# Patient Record
Sex: Female | Born: 1990 | Race: Black or African American | Hispanic: No | Marital: Single | Smoking: Former smoker
Health system: Southern US, Community
[De-identification: ages and names within clinical notes are randomized; demographics above are authoritative.]

## PROBLEM LIST (undated history)

## (undated) ENCOUNTER — Inpatient Hospital Stay (HOSPITAL_COMMUNITY): Payer: Self-pay

## (undated) DIAGNOSIS — A749 Chlamydial infection, unspecified: Secondary | ICD-10-CM

## (undated) HISTORY — PX: CYSTECTOMY: SUR359

---
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
## 2011-03-04 LAB — POCT RAPID STREP A: Streptococcus, Group A Screen (Direct): NEGATIVE

## 2011-05-27 NOTE — L&D Delivery Note (Signed)
Delivery Note At 11:49 PM a viable female was delivered via Vaginal, Vacuum (Extractor) (Presentation: Middle Occiput Anterior).  APGAR: 7, 8; weight .   Placenta status: Intact, Spontaneous.  Cord:  with the following complications: .  Cord pH: not done  Anesthesia: Epidural Local  Episiotomy: Median Lacerations:  Suture Repair: 2.0 vicryl Est. Blood Loss (mL):   Mom to postpartum.  Baby to nursery-stable.  MARSHALL,BERNARD A 02/14/2012, 12:02 AM

## 2011-06-22 ENCOUNTER — Encounter (HOSPITAL_COMMUNITY): Payer: Self-pay | Admitting: *Deleted

## 2011-06-22 ENCOUNTER — Inpatient Hospital Stay (HOSPITAL_COMMUNITY): Payer: Medicaid Other

## 2011-06-22 ENCOUNTER — Inpatient Hospital Stay (HOSPITAL_COMMUNITY)
Admission: AD | Admit: 2011-06-22 | Discharge: 2011-06-22 | Disposition: A | Discharge status: Home / Self Care | Payer: Medicaid Other | Source: Ambulatory Visit | Attending: Obstetrics & Gynecology | Admitting: Obstetrics & Gynecology

## 2011-06-22 DIAGNOSIS — O209 Hemorrhage in early pregnancy, unspecified: Secondary | ICD-10-CM | POA: Insufficient documentation

## 2011-06-22 DIAGNOSIS — Z8759 Personal history of other complications of pregnancy, childbirth and the puerperium: Secondary | ICD-10-CM

## 2011-06-22 DIAGNOSIS — O468X9 Other antepartum hemorrhage, unspecified trimester: Secondary | ICD-10-CM

## 2011-06-22 DIAGNOSIS — O26851 Spotting complicating pregnancy, first trimester: Secondary | ICD-10-CM | POA: Diagnosis present

## 2011-06-22 DIAGNOSIS — O4590 Premature separation of placenta, unspecified, unspecified trimester: Secondary | ICD-10-CM

## 2011-06-22 DIAGNOSIS — O09299 Supervision of pregnancy with other poor reproductive or obstetric history, unspecified trimester: Secondary | ICD-10-CM

## 2011-06-22 HISTORY — DX: Chlamydial infection, unspecified: A74.9

## 2011-06-22 LAB — URINALYSIS, ROUTINE W REFLEX MICROSCOPIC
Leukocytes, UA: NEGATIVE
Nitrite: NEGATIVE
Protein, ur: NEGATIVE mg/dL
Specific Gravity, Urine: 1.025 (ref 1.005–1.030)
Urobilinogen, UA: 0.2 mg/dL (ref 0.0–1.0)

## 2011-06-22 LAB — URINE MICROSCOPIC-ADD ON

## 2011-06-22 LAB — WET PREP, GENITAL: Trich, Wet Prep: NONE SEEN

## 2011-06-22 LAB — HCG, QUANTITATIVE, PREGNANCY: hCG, Beta Chain, Quant, S: 26893 m[IU]/mL — ABNORMAL HIGH (ref <5)

## 2011-06-22 LAB — POCT PREGNANCY, URINE: Preg Test, Ur: POSITIVE — AB

## 2011-06-22 LAB — ABO/RH: ABO/RH(D): AB POS

## 2011-06-22 MED ORDER — CONCEPT OB 130-92.4-1 MG PO CAPS: 1.0000 | ORAL_CAPSULE | Freq: Every day | ORAL | Status: DC

## 2011-06-22 NOTE — Progress Notes (Signed)
Pt LMP 05/15/2011, +UPT at home, spotting x 1 wk.  Denies pain.

## 2011-06-22 NOTE — Discharge Instructions (Signed)
Prenatal Care Providers °[facility]    [facility] ° Phone- [phone]     Phone: [phone] °         °[facility]                      [facility] ° @[place]     Phone: [phone] ° Phone: [phone] °        [facility] °[facility]     Phone: [phone] ° Phone: [phone]   °        [facility] °[facility] [place]                hone: [phone] ° Phone: [phone] °        [facility] °Dr. Bernard Marshall      Phone: [phone] ° Phone: [phone] °         [facility] °[facility]                Phone: [phone] ° [facility]  ° Phone:[phone]    [facility]) °         Phone: [phone] °[facility] °  Phone: [phone] °

## 2011-06-22 NOTE — ED Provider Notes (Signed)
History   Lisa Woods is a 21 y.o. year old G1P0 female at [redacted]w[redacted]d weeks gestation by uncertain, early LMP who presents to MAU reporting spotting x 1 week and pos home UPT. She denies abd pain or passage of tissue. .   Chief Complaint  Patient presents with  . Vaginal Bleeding   HPI  OB History    Grav Para Term Preterm Abortions TAB SAB Ect Mult Living   1               Past Medical History  Diagnosis Date  . Chlamydia     Past Surgical History  Procedure Date  . Cystectomy     History reviewed. No pertinent family history.  History  Substance Use Topics  . Smoking status: Current Some Day Smoker -- 0.2 packs/day    Types: Cigarettes  . Smokeless tobacco: Not on file  . Alcohol Use: No    Allergies: No Known Allergies  No prescriptions prior to admission    ROS: Otherwise neg Physical Exam   Blood pressure 126/65, pulse 82, temperature 98.5 F (36.9 C), temperature source Oral, resp. rate 16, height 5\' 4"  (1.626 m), weight 58.514 kg (129 lb), last menstrual period 05/15/2011.  Physical Exam  Constitutional: She is oriented to person, place, and time. She appears well-developed and well-nourished. No distress.  Cardiovascular: Normal rate.   Respiratory: Effort normal.  GI: Soft. There is no tenderness.  Genitourinary: There is no lesion on the right labia. There is no lesion on the left labia. Uterus is enlarged (slightly). Uterus is not tender. Cervix exhibits no motion tenderness, no discharge and no friability. Right adnexum displays no mass and no tenderness. Left adnexum displays no mass and no tenderness. There is bleeding (moderate amount of brown blood in vault. ) around the vagina.  Musculoskeletal: Normal range of motion.  Neurological: She is alert and oriented to person, place, and time.  Skin: Skin is warm and dry.  Psychiatric: She has a normal mood and affect.    MAU Course  Procedures  MDM Results for orders placed during the hospital  encounter of 06/22/11 (from the past 24 hour(s))  URINALYSIS, ROUTINE W REFLEX MICROSCOPIC     Status: Abnormal   Collection Time   06/22/11  8:32 PM      Component Value Range   Color, Urine YELLOW  YELLOW    APPearance CLEAR  CLEAR    Specific Gravity, Urine 1.025  1.005 - 1.030    pH 7.0  5.0 - 8.0    Glucose, UA NEGATIVE  NEGATIVE (mg/dL)   Hgb urine dipstick SMALL (*) NEGATIVE    Bilirubin Urine NEGATIVE  NEGATIVE    Ketones, ur NEGATIVE  NEGATIVE (mg/dL)   Protein, ur NEGATIVE  NEGATIVE (mg/dL)   Urobilinogen, UA 0.2  0.0 - 1.0 (mg/dL)   Nitrite NEGATIVE  NEGATIVE    Leukocytes, UA NEGATIVE  NEGATIVE   URINE MICROSCOPIC-ADD ON     Status: Abnormal   Collection Time   06/22/11  8:32 PM      Component Value Range   Squamous Epithelial / LPF FEW (*) RARE    WBC, UA 0-2  <3 (WBC/hpf)  POCT PREGNANCY, URINE     Status: Abnormal   Collection Time   06/22/11  8:38 PM      Component Value Range   Preg Test, Ur POSITIVE (*) NEGATIVE    US Ob Comp Less 14 Wks  06/22/2011  *RADIOLOGY REPORT*  Clinical  Data: Pregnant, spotting, 5 weeks 3 days by LMP; quantitative beta HCG 26,893  OBSTETRIC <14 WK [place] AND TRANSVAGINAL OB US  Technique:  Both transabdominal and transvaginal ultrasound examinations were performed for complete evaluation of the gestation as well as the maternal uterus, adnexal regions, and pelvic cul-de-sac.  Transvaginal technique was performed to assess early pregnancy.  Comparison:  None  Intrauterine gestational sac:  Visualized/normal in shape. Yolk sac: Present Embryo: Present Cardiac Activity: Present Heart Rate: 125 bpm  CRL: 3.6 mm     6 w   1 d           [place] EDC: 02/14/2012  Maternal uterus/adnexae: Small subchorionic hemorrhage. Trace free pelvic fluid. Right ovary normal size at 4.3 x 2.0 x 2.8 cm a small corpus luteal cyst. Left ovary normal size and morphology, 3.6 x 1.5 x 1.6 cm. No pelvic masses.  IMPRESSION: Single live early intrauterine gestation measured at 6 weeks  1 day EGA. Small subchorionic hemorrhage.  Original Report Authenticated By: Lollie Marrow, M.D.   US Ob Transvaginal  06/22/2011  *RADIOLOGY REPORT*  Clinical Data: Pregnant, spotting, 5 weeks 3 days by LMP; quantitative beta HCG 26,893  OBSTETRIC <14 WK [place] AND TRANSVAGINAL OB US  Technique:  Both transabdominal and transvaginal ultrasound examinations were performed for complete evaluation of the gestation as well as the maternal uterus, adnexal regions, and pelvic cul-de-sac.  Transvaginal technique was performed to assess early pregnancy.  Comparison:  None  Intrauterine gestational sac:  Visualized/normal in shape. Yolk sac: Present Embryo: Present Cardiac Activity: Present Heart Rate: 125 bpm  CRL: 3.6 mm     6 w   1 d           [place] EDC: 02/14/2012  Maternal uterus/adnexae: Small subchorionic hemorrhage. Trace free pelvic fluid. Right ovary normal size at 4.3 x 2.0 x 2.8 cm a small corpus luteal cyst. Left ovary normal size and morphology, 3.6 x 1.5 x 1.6 cm. No pelvic masses.  IMPRESSION: Single live early intrauterine gestation measured at 6 weeks 1 day EGA. Small subchorionic hemorrhage.  Original Report Authenticated By: Lollie Marrow, M.D.    Assessment and Plan  Assessment: 1. 6.1 week IUP w/ small [facility]  Plan: 1. D/C home 2. Start PNC 3. Pregnancy verification latter given 4. Bleeding precautions ans pelvic rest x 1 week  Dorathy Kinsman 06/22/2011, 11:14 PM

## 2011-06-22 NOTE — Progress Notes (Signed)
Spotting x1 week, no pain.

## 2011-06-29 NOTE — ED Provider Notes (Signed)
Agree with above note.  Twylia Oka H. 06/29/2011 7:34 PM

## 2011-07-29 LAB — OB RESULTS CONSOLE HEPATITIS B SURFACE ANTIGEN: Hepatitis B Surface Ag: NEGATIVE

## 2011-07-29 LAB — OB RESULTS CONSOLE ANTIBODY SCREEN: Antibody Screen: NEGATIVE

## 2011-09-30 ENCOUNTER — Other Ambulatory Visit: Payer: Self-pay | Admitting: Obstetrics & Gynecology

## 2011-09-30 DIAGNOSIS — Z3689 Encounter for other specified antenatal screening: Secondary | ICD-10-CM

## 2011-10-01 ENCOUNTER — Ambulatory Visit (HOSPITAL_COMMUNITY)
Admission: RE | Admit: 2011-10-01 | Discharge: 2011-10-01 | Payer: Medicaid Other | Source: Ambulatory Visit | Attending: Obstetrics & Gynecology | Admitting: Obstetrics & Gynecology

## 2011-10-03 ENCOUNTER — Ambulatory Visit (HOSPITAL_COMMUNITY)
Admission: RE | Admit: 2011-10-03 | Discharge: 2011-10-03 | Disposition: A | Discharge status: Home / Self Care | Payer: Medicaid Other | Source: Ambulatory Visit | Attending: Obstetrics & Gynecology | Admitting: Obstetrics & Gynecology

## 2011-10-03 DIAGNOSIS — Z3689 Encounter for other specified antenatal screening: Secondary | ICD-10-CM

## 2011-10-03 DIAGNOSIS — O358XX0 Maternal care for other (suspected) fetal abnormality and damage, not applicable or unspecified: Secondary | ICD-10-CM | POA: Insufficient documentation

## 2011-10-03 DIAGNOSIS — Z1389 Encounter for screening for other disorder: Secondary | ICD-10-CM | POA: Insufficient documentation

## 2011-10-03 DIAGNOSIS — Z363 Encounter for antenatal screening for malformations: Secondary | ICD-10-CM | POA: Insufficient documentation

## 2011-10-03 DIAGNOSIS — O99330 Smoking (tobacco) complicating pregnancy, unspecified trimester: Secondary | ICD-10-CM | POA: Insufficient documentation

## 2012-01-23 LAB — OB RESULTS CONSOLE GBS: GBS: NEGATIVE

## 2012-02-13 ENCOUNTER — Inpatient Hospital Stay (HOSPITAL_COMMUNITY): Payer: Medicaid Other | Admitting: Anesthesiology

## 2012-02-13 ENCOUNTER — Encounter (HOSPITAL_COMMUNITY): Payer: Self-pay | Admitting: Anesthesiology

## 2012-02-13 ENCOUNTER — Encounter (HOSPITAL_COMMUNITY): Payer: Self-pay | Admitting: *Deleted

## 2012-02-13 ENCOUNTER — Inpatient Hospital Stay (HOSPITAL_COMMUNITY)
Admission: AD | Admit: 2012-02-13 | Discharge: 2012-02-15 | DRG: 775 | Disposition: A | Discharge status: Home / Self Care | Payer: Medicaid Other | Source: Ambulatory Visit | Attending: Obstetrics | Admitting: Obstetrics

## 2012-02-13 DIAGNOSIS — O26851 Spotting complicating pregnancy, first trimester: Secondary | ICD-10-CM

## 2012-02-13 DIAGNOSIS — IMO0001 Reserved for inherently not codable concepts without codable children: Secondary | ICD-10-CM

## 2012-02-13 LAB — CBC
HCT: 36.7 % (ref 36.0–46.0)
Hemoglobin: 12.4 g/dL (ref 12.0–15.0)
MCH: 28.7 pg (ref 26.0–34.0)
MCHC: 33.8 g/dL (ref 30.0–36.0)
MCV: 85 fL (ref 78.0–100.0)
RBC: 4.32 MIL/uL (ref 3.87–5.11)

## 2012-02-13 MED ORDER — ACETAMINOPHEN 325 MG PO TABS
650.0000 mg | ORAL_TABLET | ORAL | Status: DC | PRN
  Administered 2012-02-13: 650 mg via ORAL
  Filled 2012-02-13: qty 2

## 2012-02-13 MED ORDER — LACTATED RINGERS IV SOLN
INTRAVENOUS | Status: DC
  Administered 2012-02-13 (×3): via INTRAVENOUS

## 2012-02-13 MED ORDER — LIDOCAINE HCL (PF) 1 % IJ SOLN
30.0000 mL | INTRAMUSCULAR | Status: DC | PRN
  Filled 2012-02-13 (×2): qty 30

## 2012-02-13 MED ORDER — SODIUM CHLORIDE 0.9 % IV SOLN
2.0000 g | Freq: Four times a day (QID) | INTRAVENOUS | Status: DC
  Filled 2012-02-13 (×3): qty 2000

## 2012-02-13 MED ORDER — OXYTOCIN BOLUS FROM INFUSION
500.0000 mL | Freq: Once | INTRAVENOUS | Status: DC
  Filled 2012-02-13: qty 500

## 2012-02-13 MED ORDER — ONDANSETRON HCL 4 MG/2ML IJ SOLN: 4.0000 mg | Freq: Four times a day (QID) | INTRAMUSCULAR | Status: DC | PRN

## 2012-02-13 MED ORDER — EPHEDRINE 5 MG/ML INJ
10.0000 mg | INTRAVENOUS | Status: DC | PRN
  Filled 2012-02-13: qty 4
  Filled 2012-02-13: qty 2

## 2012-02-13 MED ORDER — TERBUTALINE SULFATE 1 MG/ML IJ SOLN: 0.2500 mg | Freq: Once | INTRAMUSCULAR | Status: AC | PRN

## 2012-02-13 MED ORDER — LACTATED RINGERS IV SOLN
500.0000 mL | Freq: Once | INTRAVENOUS | Status: AC
  Administered 2012-02-13: 500 mL via INTRAVENOUS

## 2012-02-13 MED ORDER — OXYCODONE-ACETAMINOPHEN 5-325 MG PO TABS: 1.0000 | ORAL_TABLET | ORAL | Status: DC | PRN

## 2012-02-13 MED ORDER — EPHEDRINE 5 MG/ML INJ
10.0000 mg | INTRAVENOUS | Status: DC | PRN
  Filled 2012-02-13: qty 2

## 2012-02-13 MED ORDER — IBUPROFEN 600 MG PO TABS
600.0000 mg | ORAL_TABLET | Freq: Four times a day (QID) | ORAL | Status: DC | PRN
  Administered 2012-02-14: 600 mg via ORAL
  Filled 2012-02-13: qty 1

## 2012-02-13 MED ORDER — LACTATED RINGERS IV SOLN: 500.0000 mL | INTRAVENOUS | Status: DC | PRN

## 2012-02-13 MED ORDER — LIDOCAINE HCL (PF) 1 % IJ SOLN
INTRAMUSCULAR | Status: DC | PRN
  Administered 2012-02-13 (×2): 4 mL
  Administered 2012-02-14: 30 mL

## 2012-02-13 MED ORDER — OXYTOCIN 40 UNITS IN LACTATED RINGERS INFUSION - SIMPLE MED
1.0000 m[IU]/min | INTRAVENOUS | Status: DC
  Administered 2012-02-13: 2 m[IU]/min via INTRAVENOUS
  Filled 2012-02-13: qty 1000

## 2012-02-13 MED ORDER — FENTANYL 2.5 MCG/ML BUPIVACAINE 1/10 % EPIDURAL INFUSION (WH - ANES)
14.0000 mL/h | INTRAMUSCULAR | Status: DC
  Administered 2012-02-13 (×2): 14 mL/h via EPIDURAL
  Filled 2012-02-13 (×3): qty 60

## 2012-02-13 MED ORDER — CITRIC ACID-SODIUM CITRATE 334-500 MG/5ML PO SOLN: 30.0000 mL | ORAL | Status: DC | PRN

## 2012-02-13 MED ORDER — OXYTOCIN 40 UNITS IN LACTATED RINGERS INFUSION - SIMPLE MED: 62.5000 mL/h | Freq: Once | INTRAVENOUS | Status: DC

## 2012-02-13 MED ORDER — BUTORPHANOL TARTRATE 1 MG/ML IJ SOLN
1.0000 mg | INTRAMUSCULAR | Status: DC | PRN
  Administered 2012-02-13 (×2): 1 mg via INTRAVENOUS
  Filled 2012-02-13 (×2): qty 1

## 2012-02-13 MED ORDER — PHENYLEPHRINE 40 MCG/ML (10ML) SYRINGE FOR IV PUSH (FOR BLOOD PRESSURE SUPPORT)
80.0000 ug | PREFILLED_SYRINGE | INTRAVENOUS | Status: DC | PRN
  Filled 2012-02-13: qty 2

## 2012-02-13 MED ORDER — DIPHENHYDRAMINE HCL 50 MG/ML IJ SOLN: 12.5000 mg | INTRAMUSCULAR | Status: DC | PRN

## 2012-02-13 MED ORDER — FENTANYL 2.5 MCG/ML BUPIVACAINE 1/10 % EPIDURAL INFUSION (WH - ANES)
INTRAMUSCULAR | Status: DC | PRN
  Administered 2012-02-13: 14 mL/h via EPIDURAL

## 2012-02-13 MED ORDER — PHENYLEPHRINE 40 MCG/ML (10ML) SYRINGE FOR IV PUSH (FOR BLOOD PRESSURE SUPPORT)
80.0000 ug | PREFILLED_SYRINGE | INTRAVENOUS | Status: DC | PRN
  Filled 2012-02-13: qty 2
  Filled 2012-02-13: qty 5

## 2012-02-13 NOTE — Anesthesia Procedure Notes (Signed)
Epidural Patient location during procedure: OB Start time: 02/13/2012 2:42 PM  Staffing Anesthesiologist: Margia Wiesen A. Performed by: anesthesiologist   Preanesthetic Checklist Completed: patient identified, site marked, surgical consent, pre-op evaluation, timeout performed, IV checked, risks and benefits discussed and monitors and equipment checked  Epidural Patient position: sitting Prep: site prepped and draped and DuraPrep Patient monitoring: continuous pulse ox and blood pressure Approach: midline Injection technique: LOR air  Needle:  Needle type: Tuohy  Needle gauge: 17 G Needle length: 9 cm and 9 Needle insertion depth: 5 cm cm Catheter type: closed end flexible Catheter size: 19 Gauge Catheter at skin depth: 10 cm Test dose: negative and Other  Assessment Events: blood not aspirated, injection not painful, no injection resistance, negative IV test and no paresthesia  Additional Notes Patient identified. Risks and benefits discussed including failed block, incomplete  Pain control, post dural puncture headache, nerve damage, paralysis, blood pressure Changes, nausea, vomiting, reactions to medications-both toxic and allergic and post Partum back pain. All questions were answered. Patient expressed understanding and wished to proceed. Sterile technique was used throughout procedure. Epidural site was Dressed with sterile barrier dressing. No paresthesias, signs of intravascular injection Or signs of intrathecal spread were encountered.  Patient was more comfortable after the epidural was dosed. Please see RN's note for documentation of vital signs and FHR which are stable.

## 2012-02-13 NOTE — MAU Note (Signed)
Contractions started last night at 11, getting closer, are now 5 min apart. No bleeding or leaking.  Denies problems with preg, was 1 cm last wk.

## 2012-02-13 NOTE — H&P (Signed)
Lisa Woods is Woods 21 y.o. female presenting with contractions. Maternal Medical History:  Reason for admission: Reason for admission: contractions.  Contractions: Frequency: regular.    Fetal activity: Perceived fetal activity is normal.    Prenatal complications: no prenatal complications   OB History    Grav Para Term Preterm Abortions TAB SAB Ect Mult Living   1              Past Medical History  Diagnosis Date  . Chlamydia    Past Surgical History  Procedure Date  . Cystectomy    Family History: family history includes Cancer in her mother. Social History:  reports that she has quit smoking. Her smoking use included Cigarettes. She smoked .25 packs per day. She does not have any smokeless tobacco history on file. She reports that she does not drink alcohol or use illicit drugs.    Review of Systems  Constitutional: Negative for fever.  Eyes: Negative for blurred vision.  Respiratory: Negative for shortness of breath.   Gastrointestinal: Negative for vomiting.  Skin: Negative for rash.  Neurological: Negative for headaches.    Dilation: 6 Effacement (%): 90 Station: -2 Exam by:: hk Blood pressure 118/70, pulse 93, temperature 98.8 F (37.1 C), temperature source Oral, resp. rate 18, height 5\' 4"  (1.626 m), weight 80.74 kg (178 lb), last menstrual period 05/15/2011, SpO2 98.00%. Maternal Exam:  Uterine Assessment: Contraction frequency is irregular.   Abdomen: Patient reports no abdominal tenderness. Estimated fetal weight is 3000 - 3300 gm.   Fetal presentation: vertex  Introitus: not evaluated.   Cervix: Cervix evaluated by digital exam.     Fetal Exam Fetal Monitor Review: Variability: moderate (6-25 bpm).   Pattern: accelerations present and no decelerations.    Fetal State Assessment: Category I - tracings are normal.     Physical Exam  Constitutional: She appears well-developed.  HENT:  Head: Normocephalic.  Neck: Neck supple. No thyromegaly  present.  Cardiovascular: Normal rate and regular rhythm.   Respiratory: Breath sounds normal.  GI: Soft. Bowel sounds are normal.  Skin: No rash noted.    Prenatal labs: ABO, Rh: --/--/AB POS (01/27 2115) Antibody: Negative (03/05 0000) Rubella:   RPR: NON REACTIVE (09/20 0945)  HBsAg: Negative (03/05 0000)  HIV: Non-reactive (03/05 0000)  GBS: Negative (08/30 0000)   Assessment/Plan: Nullipara at term.  Active labor.  Category I FHT.  Admit Augment labor with low dose Pitocin per protocol   JACKSON-MOORE,Lisa Woods 02/13/2012, 4:20 PM

## 2012-02-13 NOTE — Anesthesia Preprocedure Evaluation (Signed)

## 2012-02-14 ENCOUNTER — Encounter (HOSPITAL_COMMUNITY): Payer: Self-pay | Admitting: *Deleted

## 2012-02-14 LAB — CBC
HCT: 27.7 % — ABNORMAL LOW (ref 36.0–46.0)
MCV: 86 fL (ref 78.0–100.0)
RBC: 3.22 MIL/uL — ABNORMAL LOW (ref 3.87–5.11)
RDW: 13.5 % (ref 11.5–15.5)
WBC: 15.4 10*3/uL — ABNORMAL HIGH (ref 4.0–10.5)

## 2012-02-14 MED ORDER — PNEUMOCOCCAL VAC POLYVALENT 25 MCG/0.5ML IJ INJ
0.5000 mL | INJECTION | INTRAMUSCULAR | Status: DC
  Filled 2012-02-14: qty 0.5

## 2012-02-14 MED ORDER — SIMETHICONE 80 MG PO CHEW: 80.0000 mg | CHEWABLE_TABLET | ORAL | Status: DC | PRN

## 2012-02-14 MED ORDER — INFLUENZA VIRUS VACC SPLIT PF IM SUSP: 0.5000 mL | INTRAMUSCULAR | Status: DC

## 2012-02-14 MED ORDER — LANOLIN HYDROUS EX OINT: TOPICAL_OINTMENT | CUTANEOUS | Status: DC | PRN

## 2012-02-14 MED ORDER — WITCH HAZEL-GLYCERIN EX PADS: 1.0000 "application " | MEDICATED_PAD | CUTANEOUS | Status: DC | PRN

## 2012-02-14 MED ORDER — IBUPROFEN 600 MG PO TABS
600.0000 mg | ORAL_TABLET | Freq: Four times a day (QID) | ORAL | Status: DC
  Administered 2012-02-14 – 2012-02-15 (×5): 600 mg via ORAL
  Filled 2012-02-14 (×7): qty 1

## 2012-02-14 MED ORDER — TETANUS-DIPHTH-ACELL PERTUSSIS 5-2.5-18.5 LF-MCG/0.5 IM SUSP
0.5000 mL | Freq: Once | INTRAMUSCULAR | Status: AC
  Administered 2012-02-15: 0.5 mL via INTRAMUSCULAR
  Filled 2012-02-14: qty 0.5

## 2012-02-14 MED ORDER — ZOLPIDEM TARTRATE 5 MG PO TABS: 5.0000 mg | ORAL_TABLET | Freq: Every evening | ORAL | Status: DC | PRN

## 2012-02-14 MED ORDER — SENNOSIDES-DOCUSATE SODIUM 8.6-50 MG PO TABS
2.0000 | ORAL_TABLET | Freq: Every day | ORAL | Status: DC
  Administered 2012-02-14: 2 via ORAL

## 2012-02-14 MED ORDER — ONDANSETRON HCL 4 MG/2ML IJ SOLN: 4.0000 mg | INTRAMUSCULAR | Status: DC | PRN

## 2012-02-14 MED ORDER — FERROUS SULFATE 325 (65 FE) MG PO TABS
325.0000 mg | ORAL_TABLET | Freq: Two times a day (BID) | ORAL | Status: DC
  Administered 2012-02-14 – 2012-02-15 (×3): 325 mg via ORAL
  Filled 2012-02-14 (×3): qty 1

## 2012-02-14 MED ORDER — PRENATAL MULTIVITAMIN CH
1.0000 | ORAL_TABLET | Freq: Every day | ORAL | Status: DC
  Administered 2012-02-14 – 2012-02-15 (×2): 1 via ORAL
  Filled 2012-02-14 (×2): qty 1

## 2012-02-14 MED ORDER — DIPHENHYDRAMINE HCL 25 MG PO CAPS: 25.0000 mg | ORAL_CAPSULE | Freq: Four times a day (QID) | ORAL | Status: DC | PRN

## 2012-02-14 MED ORDER — OXYCODONE-ACETAMINOPHEN 5-325 MG PO TABS
1.0000 | ORAL_TABLET | ORAL | Status: DC | PRN
  Administered 2012-02-14 (×3): 1 via ORAL
  Filled 2012-02-14 (×3): qty 1

## 2012-02-14 MED ORDER — BENZOCAINE-MENTHOL 20-0.5 % EX AERO
1.0000 "application " | INHALATION_SPRAY | CUTANEOUS | Status: DC | PRN
  Administered 2012-02-14: 1 via TOPICAL
  Filled 2012-02-14: qty 56

## 2012-02-14 MED ORDER — ONDANSETRON HCL 4 MG PO TABS: 4.0000 mg | ORAL_TABLET | ORAL | Status: DC | PRN

## 2012-02-14 MED ORDER — DIBUCAINE 1 % RE OINT
1.0000 "application " | TOPICAL_OINTMENT | RECTAL | Status: DC | PRN
  Administered 2012-02-14: 1 via RECTAL
  Filled 2012-02-14: qty 28

## 2012-02-14 NOTE — Anesthesia Postprocedure Evaluation (Signed)
  Anesthesia Post-op Note  Patient: Lisa Woods  Procedure(s) Performed: * No procedures listed *  Patient Location: PACU and Mother/Baby  Anesthesia Type: Epidural  Level of Consciousness: awake, alert , oriented and patient cooperative  Airway and Oxygen Therapy: Patient Spontanous Breathing  Post-op Pain: none  Post-op Assessment: Post-op Vital signs reviewed and Patient's Cardiovascular Status Stable  Post-op Vital Signs: Reviewed and stable  Complications: No apparent anesthesia complications

## 2012-02-14 NOTE — Progress Notes (Signed)
Patient ID: Lisa Woods, female   DOB: Aug 21, 1990, 21 y.o.   MRN: [id number] Postpartum day one Bile signs normal Fundus firm Lochia negative No complaints

## 2012-02-15 NOTE — Discharge Summary (Signed)
Obstetric Discharge Summary Reason for Admission: onset of labor Prenatal Procedures: none Intrapartum Procedures: spontaneous vaginal delivery Postpartum Procedures: none Complications-Operative and Postpartum: none Hemoglobin  Date Value Range Status  02/14/2012 9.4* 12.0 - 15.0 g/dL Final     DELTA CHECK NOTED     REPEATED TO VERIFY     HCT  Date Value Range Status  02/14/2012 27.7* 36.0 - 46.0 % Final    Physical Exam:  General: alert Lochia: appropriate Uterine Fundus: firm Incision: healing well DVT Evaluation: No evidence of DVT seen on physical exam.  Discharge Diagnoses: Term Pregnancy-delivered  Discharge Information: Date: 02/15/2012 Activity: pelvic rest Diet: routine Medications: Percocet Condition: stable Instructions: refer to practice specific booklet Discharge to: home Follow-up Information    Call in 6 weeks to follow up.         Newborn Data: Live born female  Birth Weight: 7 lb 14.8 oz (3595 g) APGAR: 7, 8  Home with mother.  Myra Weng A 02/15/2012, 6:35 AM

## 2012-02-16 NOTE — Progress Notes (Signed)
Post discharge chart review completed.  

## 2012-03-12 ENCOUNTER — Encounter: Payer: Self-pay | Admitting: Family Medicine

## 2012-03-12 ENCOUNTER — Ambulatory Visit (INDEPENDENT_AMBULATORY_CARE_PROVIDER_SITE_OTHER): Payer: Medicaid Other | Admitting: Family Medicine

## 2012-03-12 VITALS — BP 124/77 | HR 81 | Temp 97.9°F | Ht 64.5 in | Wt 163.0 lb

## 2012-03-12 DIAGNOSIS — L720 Epidermal cyst: Secondary | ICD-10-CM | POA: Insufficient documentation

## 2012-03-12 DIAGNOSIS — Z309 Encounter for contraceptive management, unspecified: Secondary | ICD-10-CM | POA: Insufficient documentation

## 2012-03-12 DIAGNOSIS — L723 Sebaceous cyst: Secondary | ICD-10-CM

## 2012-03-12 MED ORDER — MEDROXYPROGESTERONE ACETATE 150 MG/ML IM SUSP
150.0000 mg | Freq: Once | INTRAMUSCULAR | Status: AC
  Administered 2012-03-12: 150 mg via INTRAMUSCULAR

## 2012-03-12 NOTE — Assessment & Plan Note (Signed)
1cm epidermal cyst on chest wall. Will continue to follow up supportive care for now including warm compresses and do not squeeze area. Pt has appt to follow up within one month for PP visit and will readdress then. If it bothers her, becomes red, indurated, fluctuant we will discuss options. Patient agrees.

## 2012-03-12 NOTE — Assessment & Plan Note (Signed)
4 weeks PP. Desires depo. Preg test negative. Given depo today. RTC in 3 months for next injection.

## 2012-03-12 NOTE — Patient Instructions (Signed)
It was nice to see you today. Everything looks great!  Please make an appointment to come back to see me in 2-4 weeks for a post-partum visit. Let me know if you need anything.  Take care! Nysa Sarin M. Brexton Sofia, M.D.  Epidermal Cyst An epidermal cyst is sometimes called a sebaceous cyst, epidermal inclusion cyst, or infundibular cyst. These cysts usually contain a substance that looks "pasty" or "cheesy" and may have a bad smell. This substance is a protein called keratin. Epidermal cysts are usually found on the face, neck, or trunk. They may also occur in the vaginal area or other parts of the genitalia of both men and women. Epidermal cysts are usually small, painless, slow-growing bumps or lumps that move freely under the skin. It is important not to try to pop them. This may cause an infection and lead to tenderness and swelling. CAUSES  Epidermal cysts may be caused by a deep penetrating injury to the skin or a plugged hair follicle, often associated with acne. SYMPTOMS  Epidermal cysts can become inflamed and cause:  Redness.  Tenderness.  Increased temperature of the skin over the bumps or lumps.  Grayish-white, bad smelling material that drains from the bump or lump. DIAGNOSIS  Epidermal cysts are easily diagnosed by your caregiver during an exam. Rarely, a tissue sample (biopsy) may be taken to rule out other conditions that may resemble epidermal cysts. TREATMENT   Epidermal cysts often get better and disappear on their own. They are rarely ever cancerous.  If a cyst becomes infected, it may become inflamed and tender. This may require opening and draining the cyst. Treatment with antibiotics may be necessary. When the infection is gone, the cyst may be removed with minor surgery.  Small, inflamed cysts can often be treated with antibiotics or by injecting steroid medicines.  Sometimes, epidermal cysts become large and bothersome. If this happens, surgical removal in your  caregiver's office may be necessary. HOME CARE INSTRUCTIONS  Only take over-the-counter or prescription medicines as directed by your caregiver.  Take your antibiotics as directed. Finish them even if you start to feel better. SEEK MEDICAL CARE IF:   Your cyst becomes tender, red, or swollen.  Your condition is not improving or is getting worse.  You have any other questions or concerns. MAKE SURE YOU:  Understand these instructions.  Will watch your condition.  Will get help right away if you are not doing well or get worse. Document Released: 04/12/2004 Document Revised: 08/04/2011 Document Reviewed: 11/18/2010 [facility] Patient Information 2013 [place], [place].

## 2012-03-12 NOTE — Progress Notes (Signed)
Patient ID: Sharley Keeler, female   DOB: [birth year], 21 y.o.   MRN: [id number] [facility] Zane Samson M. Dion Parrow, MD Phone: [phone]   Subjective: HPI: Patient is a 21 y.o. female presenting to clinic today for new patient appointment. Concerns today include wants Depo and knot in right breast  1. Knot under breast- Painful, no redness or swelling. Had similar area when pregnant and went away on its own. 67 week old daughter, not currently breastfeeding. Still making milk but this is more chest wall than breast. Has not tried anything for the pain. Unable to express anything from area. No other boils or skin lesions.  2. Depo- 4 weeks post partum and would like Depo-Provera for contraception. UPreg negative. No sexual intercourse since delivery. Discussed pros/cons and pt agrees this is the best contraction for her. She is not currently bleeding and has not had a period since delivery.   History Reviewed: One cigarette/day smoker. Health Maintenance: No flu shot this year  ROS: Please see HPI above.  Objective: Office vital signs reviewed.  Physical Examination:  General: Awake, alert. NAD HEENT: Atraumatic, normocephalic Neck: No masses palpated. No LAD Pulm: CTAB, no wheezes Chest: 1x1 cm freely movable cyst medial to right breast. No redness. Some TTP. No pore or drainage noted.  Cardio: RRR, no murmurs appreciated Abdomen:+BS, soft, nontender, nondistended Extremities: No edema Neuro: Grossly intact  Assessment: 21 yo new patient  Plan: See Problem List and After Visit Summary

## 2012-03-23 ENCOUNTER — Ambulatory Visit (INDEPENDENT_AMBULATORY_CARE_PROVIDER_SITE_OTHER): Payer: Medicaid Other | Admitting: Family Medicine

## 2012-03-23 ENCOUNTER — Encounter: Payer: Self-pay | Admitting: Family Medicine

## 2012-03-23 NOTE — Patient Instructions (Addendum)
It was good to see you today! Everything looks great. I will see you as needed, as well as when you bring Ah'Nyla in to be seen. Let me know if you need anything!  Raneisha Bress M. Amed Datta, M.D.

## 2012-03-23 NOTE — Progress Notes (Signed)
  Subjective:     Lisa Woods is a 21 y.o. female who presents for a postpartum visit. She is 5 weeks postpartum following a vacuum assisted vaginal delivery. I have fully reviewed the prenatal and intrapartum course. The delivery was at 39 gestational weeks. Outcome: spontaneous vaginal delivery. Anesthesia: epidural. Postpartum course has been unremarkable. Baby's course has been excellent. Baby is feeding by bottle Rush Barer. Bleeding stopped, but restarted with dark bloody spotting. Bowel function is normal. Bladder function is normal. Patient is sexually active. Contraception method is Depo-Provera injections. Postpartum depression screening: negative.  The following portions of the patient's history were reviewed and updated as appropriate: allergies, current medications, past family history, past medical history, past social history, past surgical history and problem list.  Review of Systems Pertinent items are noted in HPI.   Objective:    BP 126/80  Pulse 73  Ht 5\' 4"  (1.626 m)  Wt 165 lb (74.844 kg)  BMI 28.32 kg/m2  General:  alert, cooperative and no distress   Breasts:  inspection negative, no nipple discharge or bleeding, no masses or nodularity palpable  Lungs: clear to auscultation bilaterally  Heart:  regular rate and rhythm, S1, S2 normal, no murmur, click, rub or gallop  Abdomen: soft, non-tender; bowel sounds normal; no masses,  no organomegaly   Vulva:  not evaluated  Vagina: not evaluated        Assessment:    Normal postpartum exam. Pap smear not done at today's visit.   Plan:    1. Contraception: Depo-Provera injections 2. No other concerns 3. Follow up as needed.

## 2012-05-27 ENCOUNTER — Encounter: Payer: Self-pay | Admitting: Family Medicine

## 2012-05-27 ENCOUNTER — Ambulatory Visit (INDEPENDENT_AMBULATORY_CARE_PROVIDER_SITE_OTHER): Payer: Medicaid Other | Admitting: Family Medicine

## 2012-05-27 VITALS — BP 110/68 | HR 72 | Temp 98.2°F | Ht 64.0 in | Wt 168.0 lb

## 2012-05-27 DIAGNOSIS — L02219 Cutaneous abscess of trunk, unspecified: Secondary | ICD-10-CM

## 2012-05-27 DIAGNOSIS — L02214 Cutaneous abscess of groin: Secondary | ICD-10-CM | POA: Insufficient documentation

## 2012-05-27 MED ORDER — IBUPROFEN 600 MG PO TABS: 600.0000 mg | ORAL_TABLET | Freq: Three times a day (TID) | ORAL | Status: DC | PRN

## 2012-05-27 MED ORDER — DOXYCYCLINE HYCLATE 100 MG PO TABS: 100.0000 mg | ORAL_TABLET | Freq: Two times a day (BID) | ORAL | Status: DC

## 2012-05-27 NOTE — Assessment & Plan Note (Signed)
Abscess likely from folliculitis from patient shaving pubic hairs.  Has not come to a formed abscess or head yet.  No systemic signs of infection at this time. - Will treat first initially with Doxycycline 100 mg BID x 7 days - Ibuprofen 600 mg PRN pain - Patient is not breastfeeding at this time - Discussed home instructions for draining abscess at home or she can return to have I&D in clinic after antibiotic course - Red flags reviewed  - Return to clinic as needed

## 2012-05-27 NOTE — Patient Instructions (Addendum)
It was nice to meet you today. Please pick up antibiotic and pain medication at your pharmacy and take for 7 days. Abscess may drain at home - see below for home care instructions.  If not,  You may schedule an appointment with your PCP or myself to drain in the office. If you develop worsening pain, swelling, nausea/vomiting, or fever temp 101.5 or above, please call your doctor. Hope you feel better soon.  Abscess An abscess is an infected area that contains a collection of pus and debris.It can occur in almost any part of the body. An abscess is also known as a furuncle or boil. CAUSES  An abscess occurs when tissue gets infected. This can occur from blockage of oil or sweat glands, infection of hair follicles, or a minor injury to the skin. As the body tries to fight the infection, pus collects in the area and creates pressure under the skin. This pressure causes pain. People with weakened immune systems have difficulty fighting infections and get certain abscesses more often.  SYMPTOMS Usually an abscess develops on the skin and becomes a painful mass that is red, warm, and tender. If the abscess forms under the skin, you may feel a moveable soft area under the skin. Some abscesses break open (rupture) on their own, but most will continue to get worse without care. The infection can spread deeper into the body and eventually into the bloodstream, causing you to feel ill.  DIAGNOSIS  Your caregiver will take your medical history and perform a physical exam. A sample of fluid may also be taken from the abscess to determine what is causing your infection. TREATMENT  Your caregiver may prescribe antibiotic medicines to fight the infection. However, taking antibiotics alone usually does not cure an abscess. Your caregiver may need to make a small cut (incision) in the abscess to drain the pus. In some cases, gauze is packed into the abscess to reduce pain and to continue draining the area. HOME CARE  INSTRUCTIONS   Only take over-the-counter or prescription medicines for pain, discomfort, or fever as directed by your caregiver.  If you were prescribed antibiotics, take them as directed. Finish them even if you start to feel better.  If gauze is used, follow your caregiver's directions for changing the gauze.  To avoid spreading the infection:  Keep your draining abscess covered with a bandage.  Wash your hands well.  Do not share personal care items, towels, or whirlpools with others.  Avoid skin contact with others.  Keep your skin and clothes clean around the abscess.  Keep all follow-up appointments as directed by your caregiver. SEEK MEDICAL CARE IF:   You have increased pain, swelling, redness, fluid drainage, or bleeding.  You have muscle aches, chills, or a general ill feeling.  You have a fever. MAKE SURE YOU:   Understand these instructions.  Will watch your condition.  Will get help right away if you are not doing well or get worse. Document Released: 02/19/2005 Document Revised: 11/11/2011 Document Reviewed: 07/25/2011 [facility] Patient Information 2013 [place], [place].  Abscess Care After An abscess (also called a boil or furuncle) is an infected area that contains a collection of pus. Signs and symptoms of an abscess include pain, tenderness, redness, or hardness, or you may feel a moveable soft area under your skin. An abscess can occur anywhere in the body. The infection may spread to surrounding tissues causing cellulitis. A cut (incision) by the surgeon was made over your abscess  and the pus was drained out. Gauze may have been packed into the space to provide a drain that will allow the cavity to heal from the inside outwards. The boil may be painful for 5 to 7 days. Most people with a boil do not have high fevers. Your abscess, if seen early, may not have localized, and may not have been lanced. If not, another appointment may be required for this if it  does not get better on its own or with medications. HOME CARE INSTRUCTIONS   Only take over-the-counter or prescription medicines for pain, discomfort, or fever as directed by your caregiver.  When you bathe, soak and then remove gauze or iodoform packs at least daily or as directed by your caregiver. You may then wash the wound gently with mild soapy water. Repack with gauze or do as your caregiver directs. SEEK IMMEDIATE MEDICAL CARE IF:   You develop increased pain, swelling, redness, drainage, or bleeding in the wound site.  You develop signs of generalized infection including muscle aches, chills, fever, or a general ill feeling.  An oral temperature above 102 F (38.9 C) develops, not controlled by medication. See your caregiver for a recheck if you develop any of the symptoms described above. If medications (antibiotics) were prescribed, take them as directed. Document Released: 11/28/2004 Document Revised: 08/04/2011 Document Reviewed: 07/26/2007 [facility] Patient Information 2013 [place], [place].

## 2012-05-27 NOTE — Progress Notes (Signed)
  Subjective:    Patient ID: Lisa Woods, female    DOB: Oct 15, 1990, 22 y.o.   MRN: [id number]  HPI  Patient presents to same day clinic to discuss boil on RT pelvic area.   Located on right side of pelvis.  Has been there for about 2-3 days.  Started out as pimple and has been growing in size.  It is also more painful.  Pain is constant, but she has not taken any analgesics yet.  Patient has had a boil like this before in same place.  She does shave pubic hair with a razor.    Denies any fever, chills, NS, nausea/vomiting.  Denies any vaginal bleeding, discharge, dysuria, or abdominal pain.  Review of Systems  Per HPI    Objective:   Physical Exam  Constitutional: She appears well-nourished. No distress.  Abdominal:     Skin: red, round, raised pus-filled lesion (dime size) located pelvic region RT side; tender on palpation; no head or capsule palpated at this time; no surrounding cellulitis     Assessment & Plan:

## 2012-05-28 ENCOUNTER — Ambulatory Visit: Payer: Medicaid Other | Admitting: Family Medicine

## 2012-06-09 ENCOUNTER — Ambulatory Visit: Payer: Medicaid Other

## 2012-06-14 ENCOUNTER — Ambulatory Visit (INDEPENDENT_AMBULATORY_CARE_PROVIDER_SITE_OTHER): Payer: Medicaid Other | Admitting: *Deleted

## 2012-06-14 DIAGNOSIS — Z309 Encounter for contraceptive management, unspecified: Secondary | ICD-10-CM

## 2012-06-14 MED ORDER — MEDROXYPROGESTERONE ACETATE 150 MG/ML IM SUSP
150.0000 mg | Freq: Once | INTRAMUSCULAR | Status: AC
  Administered 2012-06-14: 150 mg via INTRAMUSCULAR

## 2012-06-14 NOTE — Progress Notes (Addendum)
Next depo due April 7 thru September 13, 2012. Reminder card given.   Patient was late for Depo today.   Urine pregnancy test done and is negative. Patient denies any sexual activity in past two weeks. Advised to use extra protections for next 7 days.

## 2012-06-15 ENCOUNTER — Other Ambulatory Visit: Payer: Self-pay | Admitting: Family Medicine

## 2012-06-15 MED ORDER — MEDROXYPROGESTERONE ACETATE 150 MG/ML IM SUSP: 150.0000 mg | INTRAMUSCULAR | Status: DC

## 2012-08-30 ENCOUNTER — Ambulatory Visit (INDEPENDENT_AMBULATORY_CARE_PROVIDER_SITE_OTHER): Payer: Medicaid Other | Admitting: *Deleted

## 2012-08-30 DIAGNOSIS — Z309 Encounter for contraceptive management, unspecified: Secondary | ICD-10-CM

## 2012-08-30 MED ORDER — MEDROXYPROGESTERONE ACETATE 150 MG/ML IM SUSP
150.0000 mg | Freq: Once | INTRAMUSCULAR | Status: AC
  Administered 2012-08-30: 150 mg via INTRAMUSCULAR

## 2012-08-30 NOTE — Progress Notes (Signed)
Patient here today for Depo Provera injection.  Depo given today.  Next injection due 11/15/12-11/29/12.  Gaylene Brooks, RN

## 2012-12-13 ENCOUNTER — Other Ambulatory Visit (HOSPITAL_COMMUNITY)
Admission: RE | Admit: 2012-12-13 | Discharge: 2012-12-13 | Disposition: A | Discharge status: Home / Self Care | Payer: Medicaid Other | Source: Ambulatory Visit | Attending: Family Medicine | Admitting: Family Medicine

## 2012-12-13 ENCOUNTER — Ambulatory Visit (INDEPENDENT_AMBULATORY_CARE_PROVIDER_SITE_OTHER): Payer: Medicaid Other | Admitting: Family Medicine

## 2012-12-13 VITALS — BP 121/80 | HR 78 | Temp 98.0°F | Wt 151.0 lb

## 2012-12-13 DIAGNOSIS — Z113 Encounter for screening for infections with a predominantly sexual mode of transmission: Secondary | ICD-10-CM | POA: Insufficient documentation

## 2012-12-13 DIAGNOSIS — Z3009 Encounter for other general counseling and advice on contraception: Secondary | ICD-10-CM

## 2012-12-13 DIAGNOSIS — Z309 Encounter for contraceptive management, unspecified: Secondary | ICD-10-CM | POA: Insufficient documentation

## 2012-12-13 DIAGNOSIS — N898 Other specified noninflammatory disorders of vagina: Secondary | ICD-10-CM

## 2012-12-13 LAB — POCT WET PREP (WET MOUNT)

## 2012-12-13 LAB — POCT URINE PREGNANCY: Preg Test, Ur: NEGATIVE

## 2012-12-13 MED ORDER — MEDROXYPROGESTERONE ACETATE 150 MG/ML IM SUSP
150.0000 mg | Freq: Once | INTRAMUSCULAR | Status: AC
  Administered 2012-12-13: 150 mg via INTRAMUSCULAR

## 2012-12-13 NOTE — Assessment & Plan Note (Signed)
Discussed options, would like to continue Depo. Overdue but preg test negative. Advised to come close to the beginning of her window for next shot to help reduce amount of bleeding.

## 2012-12-13 NOTE — Assessment & Plan Note (Signed)
Will check GC/Ch and wet prep today. HIV and RPR checked with pregnancy and pt states she would not like to recheck today.

## 2012-12-13 NOTE — Progress Notes (Signed)
Patient ID: Lisa Woods, female   DOB: 1990-10-24, 22 y.o.   MRN: [id number]  [facility] Lisa Dona M. Lunell Robart, MD Phone: [phone]   Subjective: HPI: Patient is a 22 y.o. female presenting to clinic today for CPE. Concerns today include bleeding with depo  1. Contraception - Overdue for depo, pregnancy test pending. LMP 11/04/12 which was longer than usual. No spotting since then. Not currently sexually active.  2. STD check - No known exposure, no new partners. Thin discharge, no odor. No pain. No dysuria. 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    History Reviewed: Non smoker. Health Maintenance: UTD, will need pap after age 22  ROS: Please see HPI above.  Objective: Office vital signs reviewed. There were no vitals taken for this visit.  Physical Examination:  General: Awake, alert. NAD HEENT: Atraumatic, normocephalic Pulm: CTAB, no wheezes Cardio: RRR, no murmurs appreciated Abdomen:+BS, soft, nontender, nondistended GU: No external lesions. Thin white discharge at cervix. No CMT or adnexal tenderness Extremities: No edema Neuro: Grossly intact  Assessment: 22 y.o. female CPE  Plan: See Problem List and After Visit Summary

## 2012-12-13 NOTE — Patient Instructions (Addendum)
It was good to see you. I am so sorry about your wait today in clinic.  Everything looks normal, I will call you with any results.  Please make a nurse visit for your next depo.  Syleena Mchan M. Annelise Mccoy, M.D.

## 2013-06-15 ENCOUNTER — Other Ambulatory Visit (HOSPITAL_COMMUNITY)
Admission: RE | Admit: 2013-06-15 | Discharge: 2013-06-15 | Disposition: A | Discharge status: Home / Self Care | Payer: Medicaid Other | Source: Ambulatory Visit | Attending: Family Medicine | Admitting: Family Medicine

## 2013-06-15 ENCOUNTER — Encounter: Payer: Self-pay | Admitting: Family Medicine

## 2013-06-15 ENCOUNTER — Ambulatory Visit (INDEPENDENT_AMBULATORY_CARE_PROVIDER_SITE_OTHER): Payer: Medicaid Other | Admitting: Family Medicine

## 2013-06-15 VITALS — BP 119/76 | HR 86 | Temp 98.3°F | Ht 64.0 in | Wt 152.0 lb

## 2013-06-15 DIAGNOSIS — Z124 Encounter for screening for malignant neoplasm of cervix: Secondary | ICD-10-CM

## 2013-06-15 DIAGNOSIS — Z309 Encounter for contraceptive management, unspecified: Secondary | ICD-10-CM

## 2013-06-15 DIAGNOSIS — Z113 Encounter for screening for infections with a predominantly sexual mode of transmission: Secondary | ICD-10-CM | POA: Insufficient documentation

## 2013-06-15 DIAGNOSIS — N760 Acute vaginitis: Secondary | ICD-10-CM | POA: Insufficient documentation

## 2013-06-15 DIAGNOSIS — N898 Other specified noninflammatory disorders of vagina: Secondary | ICD-10-CM

## 2013-06-15 DIAGNOSIS — Z01419 Encounter for gynecological examination (general) (routine) without abnormal findings: Secondary | ICD-10-CM | POA: Insufficient documentation

## 2013-06-15 LAB — POCT URINE PREGNANCY: PREG TEST UR: NEGATIVE

## 2013-06-15 LAB — HIV ANTIBODY (ROUTINE TESTING W REFLEX): HIV: NONREACTIVE

## 2013-06-15 MED ORDER — MEDROXYPROGESTERONE ACETATE 150 MG/ML IM SUSP
150.0000 mg | Freq: Once | INTRAMUSCULAR | Status: AC
  Administered 2013-06-15: 150 mg via INTRAMUSCULAR

## 2013-06-15 NOTE — Assessment & Plan Note (Signed)
Upreg negative, given Depo.

## 2013-06-15 NOTE — Progress Notes (Signed)
  Subjective:     Lisa Woods is a 23 y.o. woman who comes in today for a  pap smear and annual exam. This is her first pap smear, she has no concerns. She would like to be checked for STD. She is not having any usual discharge, odor or discomfort. Denies any new partners. Contraception: Would like to start Depo-Provera injections today. Will need Upreg check.  The following portions of the patient's history were reviewed and updated as appropriate: allergies, current medications, past family history, past medical history, past social history, past surgical history and problem list.  Review of Systems Pertinent items are noted in HPI.   Objective:    BP 119/76  Pulse 86  Temp(Src) 98.3 F (36.8 C) (Oral)  Ht 5\' 4"  (1.626 m)  Wt 152 lb (68.947 kg)  BMI 26.08 kg/m2  LMP 05/22/2013 Pelvic Exam: cervix normal in appearance, external genitalia normal, no adnexal masses or tenderness and vagina normal without discharge. Pap smear obtained.   Assessment:    Screening pap smear.   Plan:    Follow up in 1 year, or as indicated by Pap results.  GC/Ch, HIV and RPR checked today. Will call with results.

## 2013-06-15 NOTE — Assessment & Plan Note (Signed)
STD screening completed. Pt declined wet prep.

## 2013-06-15 NOTE — Patient Instructions (Signed)
We will call you with any results you should know about. Otherwise we will send you a letter.  Please let us know if you need anything!  Tyneshia Stivers M. Farin Buhman, M.D.

## 2013-06-16 LAB — RPR

## 2013-06-17 ENCOUNTER — Encounter: Payer: Self-pay | Admitting: Family Medicine

## 2013-07-28 ENCOUNTER — Emergency Department (HOSPITAL_COMMUNITY)
Admission: EM | Admit: 2013-07-28 | Discharge: 2013-07-28 | Disposition: A | Discharge status: Home / Self Care | Payer: Medicaid Other | Attending: Emergency Medicine | Admitting: Emergency Medicine

## 2013-07-28 ENCOUNTER — Encounter (HOSPITAL_COMMUNITY): Payer: Self-pay | Admitting: Emergency Medicine

## 2013-07-28 DIAGNOSIS — Z01419 Encounter for gynecological examination (general) (routine) without abnormal findings: Secondary | ICD-10-CM

## 2013-07-28 DIAGNOSIS — Z8619 Personal history of other infectious and parasitic diseases: Secondary | ICD-10-CM | POA: Insufficient documentation

## 2013-07-28 DIAGNOSIS — F17200 Nicotine dependence, unspecified, uncomplicated: Secondary | ICD-10-CM | POA: Insufficient documentation

## 2013-07-28 DIAGNOSIS — Z0389 Encounter for observation for other suspected diseases and conditions ruled out: Secondary | ICD-10-CM | POA: Insufficient documentation

## 2013-07-28 LAB — WET PREP, GENITAL
CLUE CELLS WET PREP: NONE SEEN
Trich, Wet Prep: NONE SEEN
Yeast Wet Prep HPF POC: NONE SEEN

## 2013-07-28 NOTE — ED Notes (Addendum)
Pt presents with a possible tampon in her vagina x1 day, pt states "I think it's a tampon up there."

## 2013-07-28 NOTE — ED Provider Notes (Signed)
Medical screening examination/treatment/procedure(s) were performed by non-physician practitioner and as supervising physician I was immediately available for consultation/collaboration.   EKG Interpretation None       Shon Batonourtney F Horton, MD 07/28/13 [phone]

## 2013-07-28 NOTE — ED Provider Notes (Signed)
CSN: [id number]     Arrival date & time 07/28/13  0722 History   First MD Initiated Contact with Patient 07/28/13 [phone]     Chief Complaint  Patient presents with  . Foreign Body in Vagina     (Consider location/radiation/quality/duration/timing/severity/associated sxs/prior Treatment) HPI  Lisa Woods is a 23 y.o.female without any significant PMH presents to the ER with complaints of possible FB to vagina. She says that she completed her menstrual cycle yesterday and is concerned she may have left a tampon in her vagina. She says that she can not feel a string and did not feel anything when she self checked herself. She has not had any discomfort, pain, discharge or irregular bleeding. No abdominal pains. She came to the ER because she felt better safe than sorry. No fevers, nausea, vomiting, diarrhea.     Past Medical History  Diagnosis Date  . Chlamydia    History reviewed. No pertinent past surgical history. Family History  Problem Relation Age of Onset  . Cancer Mother     Breast Cancer   History  Substance Use Topics  . Smoking status: Current Some Day Smoker -- 0.25 packs/day    Types: Cigarettes  . Smokeless tobacco: Not on file     Comment: only smokes 1 cig a day  . Alcohol Use: No   OB History   Grav Para Term Preterm Abortions TAB SAB Ect Mult Living   1 1 1       1      Review of Systems  The patient denies anorexia, fever, weight loss, vision loss, decreased hearing, hoarseness, chest pain, syncope, dyspnea on exertion, peripheral edema, balance deficits, hemoptysis, abdominal pain, melena, hematochezia, severe indigestion/heartburn, hematuria, incontinence, genital sores, muscle weakness, suspicious skin lesions, transient blindness, difficulty walking, depression, unusual weight change, abnormal bleeding, enlarged lymph nodes, angioedema, and breast masses.   Allergies  Review of patient's allergies indicates no known allergies.  Home Medications    Current Outpatient Rx  Name  Route  Sig  Dispense  Refill  . medroxyPROGESTERone (DEPO-PROVERA) 150 MG/ML injection   Intramuscular   Inject 1 mL (150 mg total) into the muscle every 3 (three) months.   1 mL   4    BP 134/61  Pulse 74  Temp(Src) 98.7 F (37.1 C) (Oral)  Resp 16  Ht 5\' 4"  (1.626 m)  Wt 146 lb 1.6 oz (66.271 kg)  BMI 25.07 kg/m2  SpO2 97%  LMP 07/26/2013 Physical Exam  Nursing note and vitals reviewed. Constitutional: She appears well-developed and well-nourished. No distress.  HENT:  Head: Normocephalic and atraumatic.  Eyes: Pupils are equal, round, and reactive to light.  Neck: Normal range of motion. Neck supple.  Cardiovascular: Normal rate and regular rhythm.   Pulmonary/Chest: Effort normal.  Abdominal: Soft.  Genitourinary: Uterus normal. Cervix exhibits no motion tenderness, no discharge and no friability. No bleeding around the vagina. No foreign body (no foreign body visualized) around the vagina. No signs of injury around the vagina. No vaginal discharge found.  Neurological: She is alert.  Skin: Skin is warm and dry.    ED Course  Procedures (including critical care time) Labs Review Labs Reviewed  WET PREP, GENITAL - Abnormal; Notable for the following:    WBC, Wet Prep HPF POC FEW (*)    All other components within normal limits   Imaging Review No results found.   EKG Interpretation None      MDM   Final diagnoses:  Visit for pelvic exam    Patient here for concern of being unable to find Tampon and worried that it was still in her vagina. No FB visualized during pelvic. Wet prep and gc sent out. She has a few WBC but is asymptomatic. Will wait for cultures for treatment.  22 y.o.Lisa Woods's evaluation in the Emergency Department is complete. It has been determined that no acute conditions requiring further emergency intervention are present at this time. The patient/guardian have been advised of the diagnosis and plan.  We have discussed signs and symptoms that warrant return to the ED, such as changes or worsening in symptoms.  Vital signs are stable at discharge. Filed Vitals:   07/28/13 0734  BP: 134/61  Pulse: 74  Temp: 98.7 F (37.1 C)  Resp: 16    Patient/guardian has voiced understanding and agreed to follow-up with the PCP or specialist.     Dorthula Matasiffany G Linnie Delgrande, PA-C 07/28/13 [phone]

## 2013-07-28 NOTE — Discharge Instructions (Signed)
Menstruation Menstruation is the monthly passing of blood, tissue, fluid and mucus, also know as a period. Your body is shedding the lining of the uterus. The flow, or amount of blood, usually lasts from 3 7 days each month. Hormones control the menstrual cycle. Hormones are a chemical substance produced by endocrine glands in the body to regulate different bodily functions. The first menstrual period may start any time between age 22 years to 16 years. However, it usually starts around age 12 years. Some girls have regular monthly menstrual cycles right from the beginning. However, it is not unusual to have only a couple of drops of blood or spotting when you first start menstruating. It is also not unusual to have two periods a month or miss a month or two when first starting your periods. SYMPTOMS   Mild to moderate abdominal cramps.  Aching or pain in the lower back area. Symptoms may occur 5 10 days before your menstrual period starts. These symptoms are referred to as premenstrual syndrome (PMS). These symptoms can include:  Headache.  Breast tenderness and swelling.  Bloating.  Tiredness (fatigue).  Mood changes.  Craving for certain foods. These are normal signs and symptoms and can vary in severity. To help relieve these problems, ask your caregiver if you can take over-the-counter medications for pain or discomfort. If the symptoms are not controllable, see your caregiver for help.  HORMONES INVOLVED IN MENSTRUATION Menstruation comes about because of hormones produced by the pituitary gland in the brain and the ovaries that affect the uterine lining. First, the pituitary gland in the brain produces the hormone follicle stimulating hormone (FSH). FSH stimulates the ovaries to produce estrogen, which thickens the uterine lining and begins to develop an egg in the ovary. About 14 days later, the pituitary gland produces another hormone called luteinizing hormone (LH). LH causes the egg  to come out of a sac in the ovary (ovulation). The empty sac on the ovary called the corpus luteum is stimulated by another hormone from the pituitary gland called luteotropin. The corpus luteum begins to produce the estrogen and progesterone hormone. The progesterone hormone prepares the lining of the uterus to have the fertilized egg (egg combined with sperm) attach to the lining of the uterus and begin to develop into a fetus. If the egg is not fertilized, the corpus luteum stops producing estrogen and progesterone, it disappears, the lining of the uterus sloughs off and a menstrual period begins. Then the menstrual cycle starts all over again and will continue monthly unless pregnancy occurs or menopause begins. The secretion of hormones is complex. Various parts of the body become involved in many chemical activities. Female sex hormones have other functions in a woman's body as well. Estrogen increases a woman's sex drive (libido). It naturally helps body get rid of fluids (diuretic). It also aids in the process of building new bone. Therefore, maintaining hormonal health is essential to all levels of a woman's well being. These hormones are usually present in normal amounts and cause you to menstruate. It is the relationship between the (small) levels of the hormones that is critical. When the balance is upset, menstrual irregularities can occur. HOW DOES THE MENSTRUAL CYCLE HAPPEN?  Menstrual cycles vary in length from 21 35 days with an average of 29 days. The cycle begins on the first day of bleeding. At this time, the pituitary gland in the brain releases FSH that travels through the bloodstream to the ovaries. The FSH stimulates the   follicles in the ovaries. This prepares the body for ovulation that occurs around the 14th day of the cycle. The ovaries produce estrogen, and this makes sure conditions are right in the uterus for implantation of the fertilized egg.  When the levels of estrogen reach a  high enough level, it signals the gland in the brain (pituitary gland) to release a surge of LH. This causes the release of the ripest egg from its follicle (ovulation). Usually only one follicle releases one egg, but sometimes more than one follicle releases an egg especially when stimulating the ovaries for in vitro fertilization. The egg can then be collected by either fallopian tube to await fertilization. The burst follicle within the ovary that is left behind is now called the corpus luteum or "yellow body." The corpus luteum continues to give off (secrete) reduced amounts of estrogen. This closes and hardens the cervix. It dries up the mucus to the naturally infertile condition.  The corpus luteum also begins to give off greater amounts of progesterone. This causes the lining of the uterus (endometrium) to thicken even more in preparation for the fertilized egg. The egg is starting to journey down from the fallopian tube to the uterus. It also signals the ovaries to stop releasing eggs. It assists in returning the cervical mucus to its infertile state.  If the egg implants successfully into the womb lining and pregnancy occurs, progesterone levels will continue to raise. It is often this hormone that gives some pregnant women a feeling of well being, like a "natural high." Progesterone levels drop again after childbirth.  If fertilization does not occur, the corpus luteum dies, stopping the production of hormones. This sudden drop in progesterone causes the uterine lining to break down, accompanied by blood (menstruation).  This starts the cycle back at day 1. The whole process starts all over again. Woman go through this cycle every month from puberty to menopause. Women have breaks only for pregnancy and breastfeeding (lactation), unless the woman has health problems that affect the female hormone system or chooses to use oral contraceptives to have unnatural menstrual periods. HOME CARE  INSTRUCTIONS   Keep track of your periods by using a calendar.  If you use tampons, get the least absorbent to avoid toxic shock syndrome.  Do not leave tampons in the vagina over night or longer than 6 hours.  Wear a sanitary pad over night.  Exercise 3 5 times a week or more.  Avoid foods and drinks that you know will make your symptoms worse before or during your period. SEEK MEDICAL CARE IF:   You develop a fever with your period.  Your periods are lasting more than 7 days.  Your period is so heavy that you have to change pads or tampons every 30 minutes.  You develop clots with your period and never had clots before.  You cannot get relief from over-the-counter medication for your symptoms.  Your period has not started, and it has been longer than 35 days. Document Released: 05/02/2002 Document Revised: 03/02/2013 Document Reviewed: 12/09/2012 [facility] Patient Information 2014 [place], [place].  Pelvic Exam A pelvic (gynecologic) exam is an exam of a woman's outer and inner genitals and reproductive organs. At age 77, or before a woman starts to have sexual intercourse, she should have her first pelvic exam. Pelvic exams allow your caregiver to check on normal development and screen for health problems. These exams should be done regularly throughout a woman's life. Usually, a general physical exam  is done first. An exam of the breasts is also done. At this visit, you can ask questions about your health, body, menstrual cycles, sex, and birth control methods. Your caregiver will also ask you questions about your health, family health, menstrual periods, immunizations, and if you are sexually active. The information shared between you and your caregiver is kept confidential. REASONS FOR A PELVIC EXAM  Annual exam and Pap test. A Pap test removes cells from the cervix gently with a spatula and a small brush. The cells are tested for infection, precancer, and cancer.  A Pap test  is done to screen for cervical cancer.  The first Pap test should be done at age 22.  Between ages 6021 and 5829, Pap tests are repeated every 2 years.  Beginning at age 22, you are advised to have a Pap test every 3 years as long as your past 3 Pap tests have been normal.  Some women have medical problems that increase the chance of getting cervical cancer. Talk to your caregiver about these problems. It is especially important to talk to your caregiver if a new problem develops soon after your last Pap test. In these cases, your caregiver may recommend more frequent screening and Pap tests.  The above recommendations are the same for women who have or have not gotten the vaccine for HPV (Human Papillomavirus).  If you had a hysterectomy for a problem that was not cancer or a condition that could lead to cancer, then you no longer need Pap tests. However, even if you no longer need a Pap test, a regular exam is a good idea to make sure no other problems are starting.   If you are between ages 6765 and 8570, and you have had normal Pap tests going back 10 years, you no longer need Pap tests. However, even if you no longer need a Pap test, a regular exam is a good idea to make sure no other problems are starting.   If you have had past treatment for cervical cancer or a condition that could lead to cancer, you need Pap tests and screening for cancer for at least 20 years after your treatment.  If Pap tests have been discontinued, risk factors (such as a new sexual partner) need to be re-assessed to determine if screening should be resumed.  Some women may need screenings more often if they are at high risk for cervical cancer.  Make sure your female organs are normal and functioning correctly.  Evaluate a mass or other symptoms that suggest a reproductive system cancer.  Explore why you are not able to get pregnant (infertility).  Find a cause for vaginal discharge, itching, or  burning.  Get certain types of birth control or start hormone therapy.  Look for causes of urinary incontinence or sexual problems.  Look for signs of sexually transmitted infection (STI).  Follow the progression of labor.  Determine if pregnancy is present or how far advanced the pregnancy is.  You have severe cramps during your menstrual period.  You have pain during sexual intercourse.  You have abnormal menstrual periods.  You have no menstrual period by the age of 22. PROCEDURE   A pelvic exam is usually painless but may cause mild discomfort.  In unusual circumstances or in young girls, medicines may be used for comfort. A pelvic exam is not done routinely before a girl is sexually active. Special circumstances such as rape, trauma, or medical problems may require an exam.  You will remove all your clothes and will be given a gown. Usually, there is a nurse in the room during the exam and you can have someone from your family with you also.  The general physical exam will be done first.  Before the pelvic exam starts, the woman lies down on her back on a special table. She puts the heels of her feet into foot rests (stirrups) with her legs apart. A gown, cloth, or paper drape is usually placed over her belly (abdomen) and legs. First, the caregiver checks the normal arrangement of body parts of the outer genitals. This includes the clitoris, vaginal opening, hymen, labia, and the perineal area between the vagina and rectum. The labia are the skin folds surrounding the vaginal opening. The tube that carries urine (urethra) is also examined.  An internal exam is done next. First, the caregiver inserts an instrument called a speculum into the vagina. The speculum has lubricant on it. The speculum helps hold the vaginal walls apart. The caregiver can then examine the vagina and cervix, which is the opening to the womb (uterus). Cultures of any discharge may be taken to check for an  infection. A Pap test may be done.  After the internal exam is done, the speculum is removed. The caregiver uses latex gloves with a lubricant on the fingers to gently press against various pelvic organs from inside the vagina while the other hand is on the lower belly. The caregiver will note any tenderness or abnormalities.  If a pelvic exam is done on a woman who is thought to be in labor, her caregiver can check on the baby and how far her cervix has opened.  Following the exam, you will get dressed and can speak with your caregiver.  Ask your caregiver when and how often you should return for future visits. Finding out the results of your test Ask when your test results will be ready. Make sure you get your test results. TO HAVE A HEALTHY LIFESTYLE:  Follow your caregiver's advice regarding follow-up and future visits.  Get the necessary immunizations according to your age and any traveling you may do.  Eat a balanced, nourishing diet.  Get plenty of rest and sleep.  Exercise regularly.  Maintain a healthy weight.  Do not smoke or take illegal drugs.  Drink alcohol in moderation or not at all.  If you are sexually active, use some form of birth control if you do not plan to get pregnant.  If you are sexually active, practice safe sex by using a condom to protect against sexually transmitted disease (STD).  Get help or counseling if you have emotional problems. Document Released: 08/02/2002 Document Revised: 08/04/2011 Document Reviewed: 08/08/2009 [facility] Patient Information 2014 [place], [place].

## 2013-07-28 NOTE — ED Notes (Signed)
Chilton SiGreen, PA at bedside

## 2013-10-10 ENCOUNTER — Ambulatory Visit (INDEPENDENT_AMBULATORY_CARE_PROVIDER_SITE_OTHER): Payer: Medicaid Other | Admitting: Family Medicine

## 2013-10-10 DIAGNOSIS — Z309 Encounter for contraceptive management, unspecified: Secondary | ICD-10-CM

## 2013-10-10 DIAGNOSIS — N898 Other specified noninflammatory disorders of vagina: Secondary | ICD-10-CM

## 2013-10-10 LAB — POCT WET PREP (WET MOUNT)
CLUE CELLS WET PREP WHIFF POC: NEGATIVE
WBC, Wet Prep HPF POC: >20

## 2013-10-10 LAB — POCT URINE PREGNANCY: PREG TEST UR: NEGATIVE

## 2013-10-10 NOTE — Assessment & Plan Note (Signed)
Wet prep neg. Most likely due to ending Depo shots since it appears to be like blood Will get Nexplanon, info given

## 2013-10-10 NOTE — Progress Notes (Signed)
Patient ID: Lisa Woods, female   DOB: 16-Feb-1991, 23 y.o.   MRN: [id number]    Subjective:     Lisa Needleara Dolin is a 23 y.o. female who presents for evaluation of an abnormal vaginal discharge. Symptoms have now resolved, but noticed it when she missed her Depo a few weeks ago. Vaginal symptoms: discharge described as brown and odor. Contraception: none and was on Depo but missed last injection. She denies abnormal bleeding, blisters, bumps and burning Sexually transmitted infection risk: very low risk of STD exposure. Menstrual flow: irregular.  The following portions of the patient's history were reviewed and updated as appropriate: allergies, current medications, past family history, past medical history, past social history, past surgical history and problem list.   Review of Systems Pertinent items are noted in HPI.    Objective:    There were no vitals taken for this visit.  General Appearance:    Alert, cooperative, no distress, appears stated age  Head:    Normocephalic, without obvious abnormality, atraumatic  Eyes:    PERRL, conjunctiva/corneas clear, EOM's intact, fundi    benign, both eyes  Ears:    Normal TM's and external ear canals, both ears  Nose:   Nares normal, septum midline, mucosa normal, no drainage    or sinus tenderness  Throat:   Lips, mucosa, and tongue normal; teeth and gums normal  Neck:   Supple, symmetrical, trachea midline, no adenopathy;    thyroid:  no enlargement/tenderness/nodules; no carotid   bruit or JVD  Back:     Symmetric, no curvature, ROM normal, no CVA tenderness  Lungs:     Clear to auscultation bilaterally, respirations unlabored  Chest Wall:    No tenderness or deformity   Heart:    Regular rate and rhythm, S1 and S2 normal, no murmur, rub   or gallop  Breast Exam:    No tenderness, masses, or nipple abnormality  Abdomen:     Soft, non-tender, bowel sounds active all four quadrants,    no masses, no organomegaly  Genitalia:    Normal  female without lesion, discharge or tenderness  Rectal:    Normal tone, normal prostate, no masses or tenderness;   guaiac negative stool  Extremities:   Extremities normal, atraumatic, no cyanosis or edema  Pulses:   2+ and symmetric all extremities  Skin:   Skin color, texture, turgor normal, no rashes or lesions  Lymph nodes:   Cervical, supraclavicular, and axillary nodes normal  Neurologic:   CNII-XII intact, normal strength, sensation and reflexes    throughout      Assessment:    Non-infectious cause. Could be related to ending Depo/non-ovulatory bleeding.    Plan:    Symptomatic local care discussed. Discussed safe sex. Will make an appointment for Nexplanon placement

## 2013-10-10 NOTE — Patient Instructions (Signed)
Etonogestrel implant What is this medicine? ETONOGESTREL (et oh noe JES trel) is a contraceptive (birth control) device. It is used to prevent pregnancy. It can be used for up to 3 years. This medicine may be used for other purposes; ask your health care provider or pharmacist if you have questions. COMMON BRAND NAME(S): Implanon, Nexplanon  What should I tell my health care provider before I take this medicine? They need to know if you have any of these conditions: -abnormal vaginal bleeding -blood vessel disease or blood clots -cancer of the breast, cervix, or liver -depression -diabetes -gallbladder disease -headaches -heart disease or recent heart attack -high blood pressure -high cholesterol -kidney disease -liver disease -renal disease -seizures -tobacco smoker -an unusual or allergic reaction to etonogestrel, other hormones, anesthetics or antiseptics, medicines, foods, dyes, or preservatives -pregnant or trying to get pregnant -breast-feeding How should I use this medicine? This device is inserted just under the skin on the inner side of your upper arm by a health care professional. Talk to your pediatrician regarding the use of this medicine in children. Special care may be needed. Overdosage: If you think you've taken too much of this medicine contact a poison control center or emergency room at once. Overdosage: If you think you have taken too much of this medicine contact a poison control center or emergency room at once. NOTE: This medicine is only for you. Do not share this medicine with others. What if I miss a dose? This does not apply. What may interact with this medicine? Do not take this medicine with any of the following medications: -amprenavir -bosentan -fosamprenavir This medicine may also interact with the following medications: -barbiturate medicines for inducing sleep or treating seizures -certain medicines for fungal infections like ketoconazole and  itraconazole -griseofulvin -medicines to treat seizures like carbamazepine, felbamate, oxcarbazepine, phenytoin, topiramate -modafinil -phenylbutazone -rifampin -some medicines to treat HIV infection like atazanavir, indinavir, lopinavir, nelfinavir, tipranavir, ritonavir -St. John's wort This list may not describe all possible interactions. Give your health care provider a list of all the medicines, herbs, non-prescription drugs, or dietary supplements you use. Also tell them if you smoke, drink alcohol, or use illegal drugs. Some items may interact with your medicine. What should I watch for while using this medicine? This product does not protect you against HIV infection (AIDS) or other sexually transmitted diseases. You should be able to feel the implant by pressing your fingertips over the skin where it was inserted. Tell your doctor if you cannot feel the implant. What side effects may I notice from receiving this medicine? Side effects that you should report to your doctor or health care professional as soon as possible: -allergic reactions like skin rash, itching or hives, swelling of the face, lips, or tongue -breast lumps -changes in vision -confusion, trouble speaking or understanding -dark urine -depressed mood -general ill feeling or flu-like symptoms -light-colored stools -loss of appetite, nausea -right upper belly pain -severe headaches -severe pain, swelling, or tenderness in the abdomen -shortness of breath, chest pain, swelling in a leg -signs of pregnancy -sudden numbness or weakness of the face, arm or leg -trouble walking, dizziness, loss of balance or coordination -unusual vaginal bleeding, discharge -unusually weak or tired -yellowing of the eyes or skin Side effects that usually do not require medical attention (Report these to your doctor or health care professional if they continue or are bothersome.): -acne -breast pain -changes in  weight -cough -fever or chills -headache -irregular menstrual bleeding -itching, burning,   and vaginal discharge -pain or difficulty passing urine -sore throat This list may not describe all possible side effects. Call your doctor for medical advice about side effects. You may report side effects to FDA at 1-800-FDA-1088. Where should I keep my medicine? This drug is given in a hospital or clinic and will not be stored at home. NOTE: This sheet is a summary. It may not cover all possible information. If you have questions about this medicine, talk to your doctor, pharmacist, or health care provider.  2014, [employer]. (2011-11-17 15:37:45)  

## 2013-11-15 ENCOUNTER — Encounter (HOSPITAL_COMMUNITY): Payer: Self-pay | Admitting: Emergency Medicine

## 2013-11-15 ENCOUNTER — Emergency Department (HOSPITAL_COMMUNITY)
Admission: EM | Admit: 2013-11-15 | Discharge: 2013-11-15 | Disposition: A | Discharge status: Home / Self Care | Payer: Medicaid Other | Attending: Emergency Medicine | Admitting: Emergency Medicine

## 2013-11-15 DIAGNOSIS — N390 Urinary tract infection, site not specified: Secondary | ICD-10-CM

## 2013-11-15 DIAGNOSIS — B9689 Other specified bacterial agents as the cause of diseases classified elsewhere: Secondary | ICD-10-CM | POA: Insufficient documentation

## 2013-11-15 DIAGNOSIS — A499 Bacterial infection, unspecified: Secondary | ICD-10-CM | POA: Insufficient documentation

## 2013-11-15 DIAGNOSIS — F17200 Nicotine dependence, unspecified, uncomplicated: Secondary | ICD-10-CM | POA: Insufficient documentation

## 2013-11-15 DIAGNOSIS — N760 Acute vaginitis: Secondary | ICD-10-CM | POA: Insufficient documentation

## 2013-11-15 DIAGNOSIS — R197 Diarrhea, unspecified: Secondary | ICD-10-CM | POA: Insufficient documentation

## 2013-11-15 DIAGNOSIS — Z3202 Encounter for pregnancy test, result negative: Secondary | ICD-10-CM | POA: Insufficient documentation

## 2013-11-15 LAB — COMPREHENSIVE METABOLIC PANEL
ALT: 10 U/L (ref 0–35)
AST: 13 U/L (ref 0–37)
Albumin: 4.1 g/dL (ref 3.5–5.2)
Alkaline Phosphatase: 88 U/L (ref 39–117)
BUN: 9 mg/dL (ref 6–23)
CALCIUM: 9.6 mg/dL (ref 8.4–10.5)
CO2: 19 mEq/L (ref 19–32)
CREATININE: 0.9 mg/dL (ref 0.50–1.10)
Chloride: 101 mEq/L (ref 96–112)
GFR calc non Af Amer: >90 mL/min (ref >90)
GLUCOSE: 81 mg/dL (ref 70–99)
Potassium: 3.7 mEq/L (ref 3.7–5.3)
Sodium: 138 mEq/L (ref 137–147)
TOTAL PROTEIN: 7.4 g/dL (ref 6.0–8.3)
Total Bilirubin: 0.5 mg/dL (ref 0.3–1.2)

## 2013-11-15 LAB — URINE MICROSCOPIC-ADD ON

## 2013-11-15 LAB — BASIC METABOLIC PANEL
BUN: 9 mg/dL (ref 6–23)
CO2: 20 meq/L (ref 19–32)
CREATININE: 0.88 mg/dL (ref 0.50–1.10)
Calcium: 8.7 mg/dL (ref 8.4–10.5)
Chloride: 106 mEq/L (ref 96–112)
GFR calc Af Amer: >90 mL/min (ref >90)
GFR calc non Af Amer: >90 mL/min (ref >90)
Glucose, Bld: 79 mg/dL (ref 70–99)
Potassium: 3.8 mEq/L (ref 3.7–5.3)
Sodium: 138 mEq/L (ref 137–147)

## 2013-11-15 LAB — CBC WITH DIFFERENTIAL/PLATELET
Basophils Absolute: 0 10*3/uL (ref 0.0–0.1)
Basophils Relative: 0 % (ref 0–1)
EOS ABS: 0.1 10*3/uL (ref 0.0–0.7)
Eosinophils Relative: 2 % (ref 0–5)
HCT: 41 % (ref 36.0–46.0)
Hemoglobin: 14.4 g/dL (ref 12.0–15.0)
Lymphocytes Relative: 22 % (ref 12–46)
Lymphs Abs: 1.4 10*3/uL (ref 0.7–4.0)
MCH: 30.1 pg (ref 26.0–34.0)
MCHC: 35.1 g/dL (ref 30.0–36.0)
MCV: 85.6 fL (ref 78.0–100.0)
Monocytes Absolute: 0.4 10*3/uL (ref 0.1–1.0)
Monocytes Relative: 7 % (ref 3–12)
NEUTROS ABS: 4.4 10*3/uL (ref 1.7–7.7)
NEUTROS PCT: 69 % (ref 43–77)
Platelets: 152 10*3/uL (ref 150–400)
RBC: 4.79 MIL/uL (ref 3.87–5.11)
RDW: 12.1 % (ref 11.5–15.5)
WBC: 6.3 10*3/uL (ref 4.0–10.5)

## 2013-11-15 LAB — URINALYSIS, ROUTINE W REFLEX MICROSCOPIC
Glucose, UA: NEGATIVE mg/dL
HGB URINE DIPSTICK: NEGATIVE
Ketones, ur: NEGATIVE mg/dL
NITRITE: NEGATIVE
PROTEIN: NEGATIVE mg/dL
SPECIFIC GRAVITY, URINE: 1.037 — AB (ref 1.005–1.030)
Urobilinogen, UA: 1 mg/dL (ref 0.0–1.0)
pH: 6 (ref 5.0–8.0)

## 2013-11-15 LAB — WET PREP, GENITAL
TRICH WET PREP: NONE SEEN
Yeast Wet Prep HPF POC: NONE SEEN

## 2013-11-15 LAB — POC URINE PREG, ED: PREG TEST UR: NEGATIVE

## 2013-11-15 LAB — LIPASE, BLOOD: LIPASE: 22 U/L (ref 11–59)

## 2013-11-15 LAB — HIV ANTIBODY (ROUTINE TESTING W REFLEX): HIV 1&2 Ab, 4th Generation: NONREACTIVE

## 2013-11-15 LAB — RPR

## 2013-11-15 MED ORDER — KETOROLAC TROMETHAMINE 15 MG/ML IJ SOLN: 15.0000 mg | Freq: Once | INTRAMUSCULAR | Status: DC

## 2013-11-15 MED ORDER — SODIUM CHLORIDE 0.9 % IV BOLUS (SEPSIS)
1000.0000 mL | Freq: Once | INTRAVENOUS | Status: AC
  Administered 2013-11-15: 1000 mL via INTRAVENOUS

## 2013-11-15 MED ORDER — ONDANSETRON HCL 4 MG/2ML IJ SOLN
4.0000 mg | Freq: Once | INTRAMUSCULAR | Status: AC
  Administered 2013-11-15: 4 mg via INTRAVENOUS
  Filled 2013-11-15: qty 2

## 2013-11-15 MED ORDER — CEPHALEXIN 500 MG PO CAPS: 500.0000 mg | ORAL_CAPSULE | Freq: Three times a day (TID) | ORAL | Status: DC

## 2013-11-15 MED ORDER — SODIUM CHLORIDE 0.9 % IV SOLN
Freq: Once | INTRAVENOUS | Status: AC
  Administered 2013-11-15: 13:00:00 via INTRAVENOUS

## 2013-11-15 MED ORDER — METRONIDAZOLE 500 MG PO TABS: 500.0000 mg | ORAL_TABLET | Freq: Two times a day (BID) | ORAL | Status: DC

## 2013-11-15 NOTE — ED Notes (Signed)
Pt states diarrhea, abdominal pain since yesterday.  Pt states no fever.

## 2013-11-15 NOTE — ED Provider Notes (Signed)
Pt received in sign out from PA CetroniaParker at shift change.  23 year old female presenting to the ED with abdominal pain, diarrhea, and nausea without vomiting. She denies any urinary or vaginal complaints.  Basic labs were obtained revealing anion gap of 18.  U/a appears infectious.  Pelvic exam performed with few clue cells present.  Plan:  Patient was fluid resuscitated with NS.  Repeat BMP pending to re-check anion gap.  If returned to normal, discharge home with tx for UTI and BV.  Results for orders placed during the hospital encounter of 11/15/13  WET PREP, GENITAL      Result Value Ref Range   Yeast Wet Prep HPF POC NONE SEEN  NONE SEEN   Trich, Wet Prep NONE SEEN  NONE SEEN   Clue Cells Wet Prep HPF POC MODERATE (*) NONE SEEN   WBC, Wet Prep HPF POC FEW (*) NONE SEEN  CBC WITH DIFFERENTIAL      Result Value Ref Range   WBC 6.3  4.0 - 10.5 K/uL   RBC 4.79  3.87 - 5.11 MIL/uL   Hemoglobin 14.4  12.0 - 15.0 g/dL   HCT 40.941.0  81.136.0 - 91.446.0 %   MCV 85.6  78.0 - 100.0 fL   MCH 30.1  26.0 - 34.0 pg   MCHC 35.1  30.0 - 36.0 g/dL   RDW 78.212.1  95.611.5 - 21.315.5 %   Platelets 152  150 - 400 K/uL   Neutrophils Relative % 69  43 - 77 %   Neutro Abs 4.4  1.7 - 7.7 K/uL   Lymphocytes Relative 22  12 - 46 %   Lymphs Abs 1.4  0.7 - 4.0 K/uL   Monocytes Relative 7  3 - 12 %   Monocytes Absolute 0.4  0.1 - 1.0 K/uL   Eosinophils Relative 2  0 - 5 %   Eosinophils Absolute 0.1  0.0 - 0.7 K/uL   Basophils Relative 0  0 - 1 %   Basophils Absolute 0.0  0.0 - 0.1 K/uL  COMPREHENSIVE METABOLIC PANEL      Result Value Ref Range   Sodium 138  137 - 147 mEq/L   Potassium 3.7  3.7 - 5.3 mEq/L   Chloride 101  96 - 112 mEq/L   CO2 19  19 - 32 mEq/L   Glucose, Bld 81  70 - 99 mg/dL   BUN 9  6 - 23 mg/dL   Creatinine, Ser 0.860.90  0.50 - 1.10 mg/dL   Calcium 9.6  8.4 - 57.810.5 mg/dL   Total Protein 7.4  6.0 - 8.3 g/dL   Albumin 4.1  3.5 - 5.2 g/dL   AST 13  0 - 37 U/L   ALT 10  0 - 35 U/L   Alkaline Phosphatase 88   39 - 117 U/L   Total Bilirubin 0.5  0.3 - 1.2 mg/dL   GFR calc non Af Amer >90  >90 mL/min   GFR calc Af Amer >90  >90 mL/min  LIPASE, BLOOD      Result Value Ref Range   Lipase 22  11 - 59 U/L  URINALYSIS, ROUTINE W REFLEX MICROSCOPIC      Result Value Ref Range   Color, Urine AMBER (*) YELLOW   APPearance CLOUDY (*) CLEAR   Specific Gravity, Urine 1.037 (*) 1.005 - 1.030   pH 6.0  5.0 - 8.0   Glucose, UA NEGATIVE  NEGATIVE mg/dL   Hgb urine dipstick NEGATIVE  NEGATIVE  Bilirubin Urine SMALL (*) NEGATIVE   Ketones, ur NEGATIVE  NEGATIVE mg/dL   Protein, ur NEGATIVE  NEGATIVE mg/dL   Urobilinogen, UA 1.0  0.0 - 1.0 mg/dL   Nitrite NEGATIVE  NEGATIVE   Leukocytes, UA MODERATE (*) NEGATIVE  HIV ANTIBODY (ROUTINE TESTING)      Result Value Ref Range   HIV 1&2 Ab, 4th Generation NONREACTIVE  NONREACTIVE  URINE MICROSCOPIC-ADD ON      Result Value Ref Range   Squamous Epithelial / LPF FEW (*) RARE   WBC, UA 3-6  <3 WBC/hpf   RBC / HPF 0-2  <3 RBC/hpf   Bacteria, UA FEW (*) RARE   Crystals CA OXALATE CRYSTALS (*) NEGATIVE   Urine-Other MUCOUS PRESENT    RPR      Result Value Ref Range   RPR NON REAC  NON REAC  BASIC METABOLIC PANEL      Result Value Ref Range   Sodium 138  137 - 147 mEq/L   Potassium 3.8  3.7 - 5.3 mEq/L   Chloride 106  96 - 112 mEq/L   CO2 20  19 - 32 mEq/L   Glucose, Bld 79  70 - 99 mg/dL   BUN 9  6 - 23 mg/dL   Creatinine, Ser 1.610.88  0.50 - 1.10 mg/dL   Calcium 8.7  8.4 - 09.610.5 mg/dL   GFR calc non Af Amer >90  >90 mL/min   GFR calc Af Amer >90  >90 mL/min  POC URINE PREG, ED      Result Value Ref Range   Preg Test, Ur NEGATIVE  NEGATIVE   No results found.  On repeat BMP, anion gap now WNL at 12.  Pt will be discharged home with keflex and flagyl.  She will FU with PCP.  Discussed plan with patient, he/she acknowledged understanding and agreed with plan of care.  Return precautions given for new or worsening symptoms.  Garlon HatchetLisa M Sanders,  PA-C 11/15/13 2005

## 2013-11-15 NOTE — ED Provider Notes (Signed)
CSN: [id number]     Arrival date & time 11/15/13  1109 History   First MD Initiated Contact with Patient 11/15/13 1224     Chief Complaint  Patient presents with  . Abdominal Pain  . Diarrhea     (Consider location/radiation/quality/duration/timing/severity/associated sxs/prior Treatment) HPI Comments: Patient is a 23 year old female past medical history of Chlamydia presents emergency room chief complaint of abdominal discomfort since yesterday. The patient reports mid abdominal and suprapubic discomfort. She reports multiple episodes of watery stool without blood or pus. She reports nausea without emesis. The patient reports bowel movements increased discomfort. Denies dysuria and hematuria, denies abnormal vaginal discharge, abnormal vaginal bleeding. She denies previous history of abdominal surgeries or recent antibiotic use. Patient's last menstrual period was 10/02/2013.  Patient is a 23 y.o. female presenting with abdominal pain and diarrhea. The history is provided by the patient. No language interpreter was used.  Abdominal Pain Associated symptoms: diarrhea, fever and nausea   Associated symptoms: no chills, no constipation, no dysuria, no hematuria, no vaginal bleeding, no vaginal discharge and no vomiting   Diarrhea Associated symptoms: abdominal pain and fever   Associated symptoms: no chills and no vomiting     Past Medical History  Diagnosis Date  . Chlamydia    History reviewed. No pertinent past surgical history. Family History  Problem Relation Age of Onset  . Cancer Mother     Breast Cancer   History  Substance Use Topics  . Smoking status: Current Some Day Smoker -- 0.25 packs/day    Types: Cigarettes  . Smokeless tobacco: Not on file     Comment: only smokes 1 cig a day  . Alcohol Use: No   OB History   Grav Para Term Preterm Abortions TAB SAB Ect Mult Living   1 1 1       1      Review of Systems  Constitutional: Positive for fever. Negative for  chills.  Gastrointestinal: Positive for nausea, abdominal pain and diarrhea. Negative for vomiting, constipation and blood in stool.  Genitourinary: Negative for dysuria, urgency, hematuria, vaginal bleeding and vaginal discharge.      Allergies  Review of patient's allergies indicates no known allergies.  Home Medications   Prior to Admission medications   Not on File   BP 117/68  Pulse 82  Temp(Src) 98.3 F (36.8 C) (Oral)  Resp 16  SpO2 94%  LMP 10/02/2013 Physical Exam  Nursing note and vitals reviewed. Constitutional: She is oriented to person, place, and time. She appears well-developed and well-nourished.  Non-toxic appearance. She does not have a sickly appearance. She does not appear ill. No distress.  HENT:  Head: Normocephalic and atraumatic.  Eyes: EOM are normal. Pupils are equal, round, and reactive to light. Right eye exhibits no discharge. Left eye exhibits no discharge. No scleral icterus.  Neck: Normal range of motion. Neck supple.  Cardiovascular: Normal rate and regular rhythm.   No murmur heard. No lower extremity edema  Pulmonary/Chest: Effort normal and breath sounds normal. She has no wheezes. She has no rales. She exhibits no tenderness.  Abdominal: Soft. Bowel sounds are normal. She exhibits no distension. There is tenderness in the suprapubic area. There is no rebound, no guarding, no CVA tenderness, no tenderness at McBurney's point and negative Murphy's sign.  Mild suprapubic tenderness.  Genitourinary: There is lesion on the left labia. Uterus is not tender. Cervix exhibits no motion tenderness. Right adnexum displays no mass and no tenderness. Left adnexum displays  no mass and no tenderness.  Moderate amount of white discharge and cervical os. The nontender raised lesion to the left sided mons pubis, no ulceration no vesicular lesion noted. Chaperone present.    Musculoskeletal: Normal range of motion. She exhibits no edema.  Neurological: She is  alert and oriented to person, place, and time.  Skin: Skin is warm and dry. No rash noted. She is not diaphoretic.  Psychiatric: She has a normal mood and affect. Her behavior is normal. Thought content normal.    ED Course  Procedures (including critical care time) Labs Review Results for orders placed during the hospital encounter of 11/15/13  WET PREP, GENITAL      Result Value Ref Range   Yeast Wet Prep HPF POC NONE SEEN  NONE SEEN   Trich, Wet Prep NONE SEEN  NONE SEEN   Clue Cells Wet Prep HPF POC MODERATE (*) NONE SEEN   WBC, Wet Prep HPF POC FEW (*) NONE SEEN  CBC WITH DIFFERENTIAL      Result Value Ref Range   WBC 6.3  4.0 - 10.5 K/uL   RBC 4.79  3.87 - 5.11 MIL/uL   Hemoglobin 14.4  12.0 - 15.0 g/dL   HCT 16.141.0  09.636.0 - 04.546.0 %   MCV 85.6  78.0 - 100.0 fL   MCH 30.1  26.0 - 34.0 pg   MCHC 35.1  30.0 - 36.0 g/dL   RDW 40.912.1  81.111.5 - 91.415.5 %   Platelets 152  150 - 400 K/uL   Neutrophils Relative % 69  43 - 77 %   Neutro Abs 4.4  1.7 - 7.7 K/uL   Lymphocytes Relative 22  12 - 46 %   Lymphs Abs 1.4  0.7 - 4.0 K/uL   Monocytes Relative 7  3 - 12 %   Monocytes Absolute 0.4  0.1 - 1.0 K/uL   Eosinophils Relative 2  0 - 5 %   Eosinophils Absolute 0.1  0.0 - 0.7 K/uL   Basophils Relative 0  0 - 1 %   Basophils Absolute 0.0  0.0 - 0.1 K/uL  COMPREHENSIVE METABOLIC PANEL      Result Value Ref Range   Sodium 138  137 - 147 mEq/L   Potassium 3.7  3.7 - 5.3 mEq/L   Chloride 101  96 - 112 mEq/L   CO2 19  19 - 32 mEq/L   Glucose, Bld 81  70 - 99 mg/dL   BUN 9  6 - 23 mg/dL   Creatinine, Ser 7.820.90  0.50 - 1.10 mg/dL   Calcium 9.6  8.4 - 95.610.5 mg/dL   Total Protein 7.4  6.0 - 8.3 g/dL   Albumin 4.1  3.5 - 5.2 g/dL   AST 13  0 - 37 U/L   ALT 10  0 - 35 U/L   Alkaline Phosphatase 88  39 - 117 U/L   Total Bilirubin 0.5  0.3 - 1.2 mg/dL   GFR calc non Af Amer >90  >90 mL/min   GFR calc Af Amer >90  >90 mL/min  LIPASE, BLOOD      Result Value Ref Range   Lipase 22  11 - 59 U/L   URINALYSIS, ROUTINE W REFLEX MICROSCOPIC      Result Value Ref Range   Color, Urine AMBER (*) YELLOW   APPearance CLOUDY (*) CLEAR   Specific Gravity, Urine 1.037 (*) 1.005 - 1.030   pH 6.0  5.0 - 8.0   Glucose, UA  NEGATIVE  NEGATIVE mg/dL   Hgb urine dipstick NEGATIVE  NEGATIVE   Bilirubin Urine SMALL (*) NEGATIVE   Ketones, ur NEGATIVE  NEGATIVE mg/dL   Protein, ur NEGATIVE  NEGATIVE mg/dL   Urobilinogen, UA 1.0  0.0 - 1.0 mg/dL   Nitrite NEGATIVE  NEGATIVE   Leukocytes, UA MODERATE (*) NEGATIVE  URINE MICROSCOPIC-ADD ON      Result Value Ref Range   Squamous Epithelial / LPF FEW (*) RARE   WBC, UA 3-6  <3 WBC/hpf   RBC / HPF 0-2  <3 RBC/hpf   Bacteria, UA FEW (*) RARE   Crystals CA OXALATE CRYSTALS (*) NEGATIVE   Urine-Other MUCOUS PRESENT    POC URINE PREG, ED      Result Value Ref Range   Preg Test, Ur NEGATIVE  NEGATIVE     MDM   Final diagnoses:  Diarrhea  UTI (lower urinary tract infection)  BV (bacterial vaginosis)   Patient presents with multiple episodes of diarrhea since yesterday. Minimal suprapubic tenderness on exam.  Likely viral. 1:37 PM Re-eval pt sleeping comfortably in room. Reevaluation patient resting comfortably in room. Patient states improvement of symptoms with medication and fluid. Discussed test results with the patient. Patient's initial anion gap is 18, electrolytes within normal limits. BMP to reassess. Sharilyn SitesLisa Sanders, PA-C to follow up on BMP, likely discharge with UTI treatment, BV treatment, and hydration.  Meds given in ED:  Medications  ketorolac (TORADOL) 15 MG/ML injection 15 mg (0 mg Intravenous Hold 11/15/13 1500)  0.9 %  sodium chloride infusion ( Intravenous Stopped 11/15/13 1421)  ondansetron (ZOFRAN) injection 4 mg (4 mg Intravenous Given 11/15/13 1248)  sodium chloride 0.9 % bolus 1,000 mL (0 mLs Intravenous Stopped 11/15/13 1458)    New Prescriptions   No medications on file        Clabe SealLauren M Parker, PA-C 11/16/13  1235

## 2013-11-15 NOTE — Discharge Instructions (Signed)
Take the prescribed medication as directed.  Do not drink alcohol while taking flagyl, it will make you very sick. Follow-up with your primary care physician. Return to the ED for new or worsening symptoms.

## 2013-11-16 LAB — GC/CHLAMYDIA PROBE AMP
CT Probe RNA: NEGATIVE
GC Probe RNA: NEGATIVE

## 2013-11-16 NOTE — ED Provider Notes (Signed)
Medical screening examination/treatment/procedure(s) were performed by non-physician practitioner and as supervising physician I was immediately available for consultation/collaboration.   EKG Interpretation None        Forrest S Harrison, MD 11/16/13 0931 

## 2013-11-17 NOTE — ED Provider Notes (Signed)
Medical screening examination/treatment/procedure(s) were performed by non-physician practitioner and as supervising physician I was immediately available for consultation/collaboration.  Elliott L Wentz, MD 11/17/13 2230 

## 2014-03-27 ENCOUNTER — Encounter (HOSPITAL_COMMUNITY): Payer: Self-pay | Admitting: Emergency Medicine

## 2014-04-07 ENCOUNTER — Encounter (HOSPITAL_COMMUNITY): Payer: Self-pay | Admitting: Emergency Medicine

## 2014-04-07 ENCOUNTER — Emergency Department (HOSPITAL_COMMUNITY)
Admission: EM | Admit: 2014-04-07 | Discharge: 2014-04-07 | Discharge status: Against Medical Advice | Payer: Medicaid Other | Attending: Emergency Medicine | Admitting: Emergency Medicine

## 2014-04-07 DIAGNOSIS — Z720 Tobacco use: Secondary | ICD-10-CM | POA: Diagnosis not present

## 2014-04-07 DIAGNOSIS — K649 Unspecified hemorrhoids: Secondary | ICD-10-CM | POA: Insufficient documentation

## 2014-04-07 NOTE — ED Notes (Signed)
Pt. reports hemorrhoids swelling/pain with mild bleeding onset 3 days ago .

## 2014-04-07 NOTE — ED Notes (Signed)
No answer x2, pager [phone] returned to the front desk

## 2014-04-07 NOTE — ED Notes (Signed)
Family at bedside. 

## 2014-04-07 NOTE — ED Notes (Signed)
Pager [phone]

## 2014-04-14 ENCOUNTER — Emergency Department (HOSPITAL_COMMUNITY)
Admission: EM | Admit: 2014-04-14 | Discharge: 2014-04-14 | Disposition: A | Discharge status: Home / Self Care | Payer: Medicaid Other | Attending: Emergency Medicine | Admitting: Emergency Medicine

## 2014-04-14 ENCOUNTER — Emergency Department (HOSPITAL_COMMUNITY): Payer: Medicaid Other

## 2014-04-14 ENCOUNTER — Encounter (HOSPITAL_COMMUNITY): Payer: Self-pay | Admitting: Vascular Surgery

## 2014-04-14 DIAGNOSIS — Z3202 Encounter for pregnancy test, result negative: Secondary | ICD-10-CM | POA: Diagnosis not present

## 2014-04-14 DIAGNOSIS — K5900 Constipation, unspecified: Secondary | ICD-10-CM | POA: Diagnosis not present

## 2014-04-14 DIAGNOSIS — R1084 Generalized abdominal pain: Secondary | ICD-10-CM | POA: Diagnosis present

## 2014-04-14 DIAGNOSIS — Z8619 Personal history of other infectious and parasitic diseases: Secondary | ICD-10-CM | POA: Insufficient documentation

## 2014-04-14 DIAGNOSIS — Z792 Long term (current) use of antibiotics: Secondary | ICD-10-CM | POA: Insufficient documentation

## 2014-04-14 DIAGNOSIS — Z720 Tobacco use: Secondary | ICD-10-CM | POA: Insufficient documentation

## 2014-04-14 DIAGNOSIS — R109 Unspecified abdominal pain: Secondary | ICD-10-CM

## 2014-04-14 LAB — URINALYSIS, ROUTINE W REFLEX MICROSCOPIC
BILIRUBIN URINE: NEGATIVE
GLUCOSE, UA: NEGATIVE mg/dL
HGB URINE DIPSTICK: NEGATIVE
KETONES UR: NEGATIVE mg/dL
NITRITE: NEGATIVE
PH: 7 (ref 5.0–8.0)
Protein, ur: NEGATIVE mg/dL
SPECIFIC GRAVITY, URINE: 1.023 (ref 1.005–1.030)
Urobilinogen, UA: 0.2 mg/dL (ref 0.0–1.0)

## 2014-04-14 LAB — POC URINE PREG, ED: Preg Test, Ur: NEGATIVE

## 2014-04-14 LAB — URINE MICROSCOPIC-ADD ON

## 2014-04-14 NOTE — Discharge Instructions (Signed)
Take miralax until stools are normal.

## 2014-04-14 NOTE — ED Provider Notes (Addendum)
CSN: [id number]     Arrival date & time 04/14/14  0708 History   First MD Initiated Contact with Patient 04/14/14 0710     Chief Complaint  Patient presents with  . Abdominal Pain     (Consider location/radiation/quality/duration/timing/severity/associated sxs/prior Treatment) Patient is a 23 y.o. female presenting with abdominal pain. The history is provided by the patient.  Abdominal Pain Pain location:  Generalized Pain quality: bloating and cramping   Pain radiates to:  Does not radiate Pain severity:  Moderate Onset quality:  Gradual Duration:  3 days Timing:  Intermittent Progression:  Waxing and waning Chronicity:  New Context comment:  States last week had hemorrhoids and was not having BMs as much but they are getting better but now having fewer BMs and stomach cramps Relieved by:  Nothing Exacerbated by: trying to have BM. Ineffective treatments: gas x. Associated symptoms: constipation   Associated symptoms: no anorexia, no chest pain, no cough, no diarrhea, no dysuria, no fever, no hematuria, no nausea, no shortness of breath, no vaginal bleeding, no vaginal discharge and no vomiting   Risk factors: not pregnant     Past Medical History  Diagnosis Date  . Chlamydia    History reviewed. No pertinent past surgical history. Family History  Problem Relation Age of Onset  . Cancer Mother     Breast Cancer   History  Substance Use Topics  . Smoking status: Current Some Day Smoker -- 0.25 packs/day    Types: Cigarettes  . Smokeless tobacco: Not on file     Comment: only smokes 1 cig a day  . Alcohol Use: No   OB History    Gravida Para Term Preterm AB TAB SAB Ectopic Multiple Living   1 1 1       1      Review of Systems  Constitutional: Negative for fever.  Respiratory: Negative for cough and shortness of breath.   Cardiovascular: Negative for chest pain.  Gastrointestinal: Positive for abdominal pain and constipation. Negative for nausea, vomiting,  diarrhea and anorexia.  Genitourinary: Negative for dysuria, hematuria, vaginal bleeding and vaginal discharge.  All other systems reviewed and are negative.     Allergies  Review of patient's allergies indicates no known allergies.  Home Medications   Prior to Admission medications   Medication Sig Start Date End Date Taking? Authorizing Provider  ibuprofen (ADVIL,MOTRIN) 200 MG tablet Take 600 mg by mouth every 6 (six) hours as needed for mild pain.   Yes Historical Provider, MD  cephALEXin (KEFLEX) 500 MG capsule Take 1 capsule (500 mg total) by mouth 3 (three) times daily. 11/15/13   Garlon HatchetLisa M Sanders, PA-C  metroNIDAZOLE (FLAGYL) 500 MG tablet Take 1 tablet (500 mg total) by mouth 2 (two) times daily. Patient not taking: Reported on 04/07/2014 11/15/13   Garlon HatchetLisa M Sanders, PA-C   BP 119/82 mmHg  Pulse 78  Temp(Src) 98.1 F (36.7 C) (Oral)  Resp 14  SpO2 100%  LMP 04/07/2014 (Approximate) Physical Exam  Constitutional: She is oriented to person, place, and time. She appears well-developed and well-nourished. No distress.  HENT:  Head: Normocephalic and atraumatic.  Mouth/Throat: Oropharynx is clear and moist.  Eyes: Conjunctivae and EOM are normal. Pupils are equal, round, and reactive to light.  Neck: Normal range of motion. Neck supple.  Cardiovascular: Normal rate, regular rhythm and intact distal pulses.   No murmur heard. Pulmonary/Chest: Effort normal and breath sounds normal. No respiratory distress. She has no wheezes. She has no  rales.  Abdominal: Soft. Bowel sounds are normal. She exhibits no distension. There is tenderness. There is no rebound and no guarding.  abd is soft with only mild tenderness in LLQ and suprapubic area  Genitourinary:  Hemorrhoids which are currently non-tender and not bleeding  Musculoskeletal: Normal range of motion. She exhibits no edema or tenderness.  Neurological: She is alert and oriented to person, place, and time.  Skin: Skin is warm  and dry. No rash noted. No erythema.  Psychiatric: She has a normal mood and affect. Her behavior is normal.  Nursing note and vitals reviewed.   ED Course  Procedures (including critical care time) Labs Review Labs Reviewed  URINALYSIS, ROUTINE W REFLEX MICROSCOPIC - Abnormal; Notable for the following:    Leukocytes, UA TRACE (*)    All other components within normal limits  URINE MICROSCOPIC-ADD ON - Abnormal; Notable for the following:    Squamous Epithelial / LPF FEW (*)    All other components within normal limits  POC URINE PREG, ED    Imaging Review Dg Abd 1 View  04/14/2014   CLINICAL DATA:  23 year old female with abdominal pain. History of constipation.  EXAM: ABDOMEN - 1 VIEW  COMPARISON:  None.  FINDINGS: No abnormally distended small bowel or colon.  Small amount of centralized bowel gas.  No unexpected radiopaque foreign body.  No unexpected calcifications.  No evidence of organomegaly.  No displaced fracture.  Pelvic phleboliths. Mild apex left curvature of the lumbar spine, potentially positional. 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  Moderate to advanced stool burden, with stool involving the ascending colon, hepatic flexure and transverse colon, splenic flexure and descending colon.  IMPRESSION: Large stool burden, without evidence of obstruction.  Signed,  Yvone NeuJaime S. Loreta AveWagner, DO  [facility]  [facility]   Electronically Signed   By: Gilmer MorJaime  Wagner D.O.   On: 04/14/2014 08:29     EKG Interpretation None      MDM   Final diagnoses:  Abdominal pain  Constipation, unspecified constipation type    Patient with a history of hemorrhoids last week which she states she was trying not to have bowel movements because of the pain. She states since that time the hemorrhoids are healing however now she is having diffuse intermittent abdominal cramps and pains for the last 3 days. It's worse when she attempts to  have a bowel movement. She tried Gas-X without improvement but thinks she may be constipated. She also states she eats a very poor diet junk food. She is not attempted any stool softeners or laxatives. She is well-appearing on exam with normal vital signs. Her abdomen is benign with only mild tenderness in the left lower quadrant suprapubic area. She denies any urinary or vaginal symptoms. LMP was 2 weeks ago.  UA, UPT, KUB pending. feel most likely patient is constipated.  8:38 AM UA and UPT neg.  KUB with large stool burden.  Pt will try miralax and diet change.  Gwyneth SproutWhitney Dustyn Armbrister, MD 04/14/14 [id number]  Gwyneth SproutWhitney Carriann Hesse, MD 04/14/14 [id number]

## 2014-04-14 NOTE — ED Notes (Signed)
Pt reports to the ED for abd pain x 3 days. The pains are intermittent and it is aggravated when she is straining to have a BM. Pt describes the pain as sharp and localized them to her entire abdomen. Pt also reports scant bleeding with BMs and N/V. Denies any episodes of emesis in the past 24 hrs, reports last episode was on 11/18. Pt denies any hematemesis, fevers, chills, vaginal d/c or bleeding, or urinary symptoms. Pt denies any hx of abdominal surgeries. Pt A&Ox4, resp e/u, and skin warm and dry.

## 2014-04-25 ENCOUNTER — Encounter (HOSPITAL_COMMUNITY): Payer: Self-pay | Admitting: Emergency Medicine

## 2014-04-25 ENCOUNTER — Emergency Department (HOSPITAL_COMMUNITY)
Admission: EM | Admit: 2014-04-25 | Discharge: 2014-04-25 | Disposition: A | Discharge status: Home / Self Care | Payer: Medicaid Other | Attending: Emergency Medicine | Admitting: Emergency Medicine

## 2014-04-25 ENCOUNTER — Emergency Department (HOSPITAL_COMMUNITY): Payer: Medicaid Other

## 2014-04-25 DIAGNOSIS — Z720 Tobacco use: Secondary | ICD-10-CM | POA: Diagnosis not present

## 2014-04-25 DIAGNOSIS — Z8619 Personal history of other infectious and parasitic diseases: Secondary | ICD-10-CM | POA: Insufficient documentation

## 2014-04-25 DIAGNOSIS — Z3202 Encounter for pregnancy test, result negative: Secondary | ICD-10-CM | POA: Diagnosis not present

## 2014-04-25 DIAGNOSIS — R1012 Left upper quadrant pain: Secondary | ICD-10-CM | POA: Diagnosis not present

## 2014-04-25 DIAGNOSIS — Z792 Long term (current) use of antibiotics: Secondary | ICD-10-CM | POA: Diagnosis not present

## 2014-04-25 DIAGNOSIS — Z79899 Other long term (current) drug therapy: Secondary | ICD-10-CM | POA: Insufficient documentation

## 2014-04-25 DIAGNOSIS — R079 Chest pain, unspecified: Secondary | ICD-10-CM | POA: Diagnosis present

## 2014-04-25 DIAGNOSIS — R10812 Left upper quadrant abdominal tenderness: Secondary | ICD-10-CM

## 2014-04-25 LAB — BASIC METABOLIC PANEL
ANION GAP: 15 (ref 5–15)
BUN: 11 mg/dL (ref 6–23)
CHLORIDE: 103 meq/L (ref 96–112)
CO2: 22 mEq/L (ref 19–32)
CREATININE: 0.89 mg/dL (ref 0.50–1.10)
Calcium: 9.5 mg/dL (ref 8.4–10.5)
Glucose, Bld: 94 mg/dL (ref 70–99)
Potassium: 3.8 mEq/L (ref 3.7–5.3)
Sodium: 140 mEq/L (ref 137–147)

## 2014-04-25 LAB — CBC
HCT: 34.5 % — ABNORMAL LOW (ref 36.0–46.0)
Hemoglobin: 11.9 g/dL — ABNORMAL LOW (ref 12.0–15.0)
MCH: 30.2 pg (ref 26.0–34.0)
MCHC: 34.5 g/dL (ref 30.0–36.0)
MCV: 87.6 fL (ref 78.0–100.0)
PLATELETS: 229 10*3/uL (ref 150–400)
RBC: 3.94 MIL/uL (ref 3.87–5.11)
RDW: 11.7 % (ref 11.5–15.5)
WBC: 6.6 10*3/uL (ref 4.0–10.5)

## 2014-04-25 LAB — I-STAT TROPONIN, ED: Troponin i, poc: 0.01 ng/mL (ref 0.00–0.08)

## 2014-04-25 LAB — POC URINE PREG, ED: PREG TEST UR: NEGATIVE

## 2014-04-25 MED ORDER — DOCUSATE SODIUM 100 MG PO CAPS: 100.0000 mg | ORAL_CAPSULE | Freq: Two times a day (BID) | ORAL | Status: DC

## 2014-04-25 MED ORDER — KETOROLAC TROMETHAMINE 30 MG/ML IJ SOLN
30.0000 mg | Freq: Once | INTRAMUSCULAR | Status: AC
  Administered 2014-04-25: 30 mg via INTRAVENOUS
  Filled 2014-04-25: qty 1

## 2014-04-25 MED ORDER — MAGNESIUM CITRATE PO SOLN
1.0000 | Freq: Once | ORAL | Status: AC
  Administered 2014-04-25: 1 via ORAL
  Filled 2014-04-25: qty 296

## 2014-04-25 NOTE — ED Notes (Signed)
Pt. reports left anterior ribcage pain for 3 weeks , denies injury , no SOB , nausea or diaphoresis .

## 2014-04-25 NOTE — ED Provider Notes (Signed)
CSN: [id number]     Arrival date & time 04/25/14  0447 History   First MD Initiated Contact with Patient 04/25/14 0606     Chief Complaint  Patient presents with  . Chest Pain     (Consider location/radiation/quality/duration/timing/severity/associated sxs/prior Treatment) HPI  Idalia Needleara Wible is a 23 y.o. female with PMH of constipation and diarrhea presenting with one week of intermittent abdominal tightness under the left anterior rib cage and at times migrates to under the right anterior rib cage. Patient states it feels just like gas pain. She has taken Ex-Lax and had a bowel movement yesterday which improved her pain. She woke up this morning and the pain was severe. She has had no nausea or vomiting. Pt endorses anorexia. Her stools are without blood. On further clarification patient's pain is in her abdomen not her chest. She denies shortness of breath, nausea, diaphoresis. Pain worse with movement but not activity or with exertion. Not worse with deep breathing. Patient without history of hypertension, dyslipidemia. Patient without diabetes and she does smoke. FM of MI in father at age 22. She denies urinary complaints or vaginal complaints. No injury or trauma. Pt denies estrogen use, hemoptysis, history of DVT and PE, no unilateral leg swelling, no recent trauma or surgeries.    Past Medical History  Diagnosis Date  . Chlamydia    History reviewed. No pertinent past surgical history. Family History  Problem Relation Age of Onset  . Cancer Mother     Breast Cancer   History  Substance Use Topics  . Smoking status: Current Some Day Smoker -- 0.25 packs/day    Types: Cigarettes  . Smokeless tobacco: Not on file     Comment: only smokes 1 cig a day  . Alcohol Use: No   OB History    Gravida Para Term Preterm AB TAB SAB Ectopic Multiple Living   1 1 1       1      Review of Systems  Constitutional: Negative for fever and chills.  HENT: Negative for congestion and rhinorrhea.    Respiratory: Negative for cough and shortness of breath.   Cardiovascular: Negative for chest pain and palpitations.  Gastrointestinal: Negative for nausea, vomiting and diarrhea.  Genitourinary: Negative for dysuria and hematuria.  Musculoskeletal: Negative for back pain and gait problem.  Skin: Negative for rash.  Neurological: Negative for weakness and headaches.      Allergies  Review of patient's allergies indicates no known allergies.  Home Medications   Prior to Admission medications   Medication Sig Start Date End Date Taking? Authorizing Provider  ibuprofen (ADVIL,MOTRIN) 200 MG tablet Take 600 mg by mouth every 6 (six) hours as needed for mild pain.   Yes Historical Provider, MD  Sennosides (EX-LAX PO) Take 2 tablets by mouth daily as needed (gas).   Yes Historical Provider, MD  cephALEXin (KEFLEX) 500 MG capsule Take 1 capsule (500 mg total) by mouth 3 (three) times daily. Patient not taking: Reported on 04/25/2014 11/15/13   Garlon HatchetLisa M Sanders, PA-C  docusate sodium (COLACE) 100 MG capsule Take 1 capsule (100 mg total) by mouth every 12 (twelve) hours. 04/25/14   Louann SjogrenVictoria L Cyan Clippinger, PA-C  metroNIDAZOLE (FLAGYL) 500 MG tablet Take 1 tablet (500 mg total) by mouth 2 (two) times daily. Patient not taking: Reported on 04/25/2014 11/15/13   Garlon HatchetLisa M Sanders, PA-C   BP 123/68 mmHg  Pulse 76  Temp(Src) 98.5 F (36.9 C) (Oral)  Resp 23  Ht 5\' 4"  (  1.626 m)  Wt 133 lb (60.328 kg)  BMI 22.82 kg/m2  SpO2 100%  LMP 04/07/2014 (Approximate) Physical Exam  Constitutional: She appears well-developed and well-nourished. No distress.  HENT:  Head: Normocephalic and atraumatic.  Eyes: Conjunctivae and EOM are normal. Right eye exhibits no discharge. Left eye exhibits no discharge.  Neck: No JVD present.  Cardiovascular: Normal rate, regular rhythm and normal heart sounds.   No leg swelling or tenderness. Negative Homan's sign.  Pulmonary/Chest: Effort normal and breath sounds normal. No  respiratory distress. She has no wheezes.  Abdominal: Soft. Bowel sounds are normal. She exhibits no distension.  Tenderness to palpation of LUQ, reproducible. No rebound, guarding, rigidity. No signs of peritonitis. No CVA tenderness. No chest tenderness.  Neurological: She is alert. She exhibits normal muscle tone. Coordination normal.  Skin: Skin is warm and dry. She is not diaphoretic.  Nursing note and vitals reviewed.   ED Course  Procedures (including critical care time) Labs Review Labs Reviewed  CBC - Abnormal; Notable for the following:    Hemoglobin 11.9 (*)    HCT 34.5 (*)    All other components within normal limits  BASIC METABOLIC PANEL  I-STAT TROPOININ, ED  POC URINE PREG, ED    Imaging Review Dg Ribs Unilateral W/chest Left  04/25/2014   CLINICAL DATA:  Felt pain in left lower ribs for the past 2 weeks upon standing. Initial encounter.  EXAM: LEFT RIBS AND CHEST - 3+ VIEW  COMPARISON:  None.  FINDINGS: No displaced rib fractures are seen.  The lungs are well-aerated and clear. There is no evidence of focal opacification, pleural effusion or pneumothorax.  The cardiomediastinal silhouette is within normal limits. No acute osseous abnormalities are seen.  IMPRESSION: No displaced rib fracture seen; no acute cardiopulmonary process identified.   Electronically Signed   By: Roanna RaiderJeffery  Chang M.D.   On: 04/25/2014 06:28     EKG Interpretation None      Meds given in ED:  Medications  magnesium citrate solution 1 Bottle (1 Bottle Oral Given 04/25/14 0642)  ketorolac (TORADOL) 30 MG/ML injection 30 mg (30 mg Intravenous Given 04/25/14 0656)    New Prescriptions   DOCUSATE SODIUM (COLACE) 100 MG CAPSULE    Take 1 capsule (100 mg total) by mouth every 12 (twelve) hours.      MDM   Final diagnoses:  LUQ abdominal tenderness   Chest pain is not likely of cardiac or pulmonary etiology d/t presentation, PERC negative, low risk HEART score. VSS, no JVD or new murmur,  RRR, breath sounds equal bilaterally, EKG without acute abnormalities, negative troponin and negative CXR. Pt pain likely abdominal pain and with her history of constipation, pt stating it feels like gas pain and improvement with mag citrate, pain likely related to this. Pt has been advised to return to the ED if CP becomes exertional, associated with diaphoresis or nausea, radiates to left jaw/arm, worsens or becomes concerning in any way. Pt to initiate fiber supplement, stool softener and take miralax. Pt to follow up with PCP. Pt appears reliable for follow up and is agreeable to discharge.   Discussed return precautions with patient. Discussed all results and patient verbalizes understanding and agrees with plan.  Case has been discussed with Dr. Ranae PalmsYelverton who agrees with the above plan and to discharge.        Louann SjogrenVictoria L Giannah Zavadil, PA-C 04/25/14 0730  Loren Raceravid Yelverton, MD 04/26/14 [phone]

## 2014-04-25 NOTE — ED Notes (Signed)
TurkeyVictoria, PA-C, at the bedside to update patient on plan of care.

## 2014-04-25 NOTE — Discharge Instructions (Signed)
Return to the emergency room with worsening of symptoms, new symptoms or with symptoms that are concerning, especially chest pain that feels like a pressure, spreads to left arm or jaw, worse with exertion, associated with nausea, vomiting, shortness of breath and/or sweating.  Start taking colace (stool softener) and metamucil daily.  Miralax: 3 capfuls in large Gatorade over the course of the day when stool becomes loose decrease to one capful a day for 1 week. Please call your doctor for a followup appointment within 24-48 hours. When you talk to your doctor please let them know that you were seen in the emergency department and have them acquire all of your records so that they can discuss the findings with you and formulate a treatment plan to fully care for your new and ongoing problems. If you do not have a primary care provider please call the number below under ED resources to establish care with a provider and follow up.    Chest Pain (Nonspecific) It is often hard to give a specific diagnosis for the cause of chest pain. There is always a chance that your pain could be related to something serious, such as a heart attack or a blood clot in the lungs. You need to follow up with your health care provider for further evaluation. CAUSES   Heartburn.  Pneumonia or bronchitis.  Anxiety or stress.  Inflammation around your heart (pericarditis) or lung (pleuritis or pleurisy).  A blood clot in the lung.  A collapsed lung (pneumothorax). It can develop suddenly on its own (spontaneous pneumothorax) or from trauma to the chest.  Shingles infection (herpes zoster virus). The chest wall is composed of bones, muscles, and cartilage. Any of these can be the source of the pain.  The bones can be bruised by injury.  The muscles or cartilage can be strained by coughing or overwork.  The cartilage can be affected by inflammation and become sore (costochondritis). DIAGNOSIS  Lab tests or other  studies may be needed to find the cause of your pain. Your health care provider may have you take a test called an ambulatory electrocardiogram (ECG). An ECG records your heartbeat patterns over a 24-hour period. You may also have other tests, such as:  Transthoracic echocardiogram (TTE). During echocardiography, sound waves are used to evaluate how blood flows through your heart.  Transesophageal echocardiogram (TEE).  Cardiac monitoring. This allows your health care provider to monitor your heart rate and rhythm in real time.  Holter monitor. This is a portable device that records your heartbeat and can help diagnose heart arrhythmias. It allows your health care provider to track your heart activity for several days, if needed.  Stress tests by exercise or by giving medicine that makes the heart beat faster. TREATMENT   Treatment depends on what may be causing your chest pain. Treatment may include:  Acid blockers for heartburn.  Anti-inflammatory medicine.  Pain medicine for inflammatory conditions.  Antibiotics if an infection is present.  You may be advised to change lifestyle habits. This includes stopping smoking and avoiding alcohol, caffeine, and chocolate.  You may be advised to keep your head raised (elevated) when sleeping. This reduces the chance of acid going backward from your stomach into your esophagus. Most of the time, nonspecific chest pain will improve within 2-3 days with rest and mild pain medicine.  HOME CARE INSTRUCTIONS   If antibiotics were prescribed, take them as directed. Finish them even if you start to feel better.  For the  next few days, avoid physical activities that bring on chest pain. Continue physical activities as directed.  Do not use any tobacco products, including cigarettes, chewing tobacco, or electronic cigarettes.  Avoid drinking alcohol.  Only take medicine as directed by your health care provider.  Follow your health care  provider's suggestions for further testing if your chest pain does not go away.  Keep any follow-up appointments you made. If you do not go to an appointment, you could develop lasting (chronic) problems with pain. If there is any problem keeping an appointment, call to reschedule. SEEK MEDICAL CARE IF:   Your chest pain does not go away, even after treatment.  You have a rash with blisters on your chest.  You have a fever. SEEK IMMEDIATE MEDICAL CARE IF:   You have increased chest pain or pain that spreads to your arm, neck, jaw, back, or abdomen.  You have shortness of breath.  You have an increasing cough, or you cough up blood.  You have severe back or abdominal pain.  You feel nauseous or vomit.  You have severe weakness.  You faint.  You have chills. This is an emergency. Do not wait to see if the pain will go away. Get medical help at once. Call your local emergency services (911 in [place] Do not drive yourself to the hospital. MAKE SURE YOU:   Understand these instructions.  Will watch your condition.  Will get help right away if you are not doing well or get worse. Document Released: 02/19/2005 Document Revised: 05/17/2013 Document Reviewed: 12/16/2007 [place] [facility]ExitCare Patient Information 2015 [place], [place]. This information is not intended to replace advice given to you by your health care provider. Make sure you discuss any questions you have with your health care provider.

## 2014-05-01 ENCOUNTER — Ambulatory Visit (INDEPENDENT_AMBULATORY_CARE_PROVIDER_SITE_OTHER): Payer: Medicaid Other | Admitting: Family Medicine

## 2014-05-01 ENCOUNTER — Encounter: Payer: Self-pay | Admitting: Family Medicine

## 2014-05-01 VITALS — BP 107/70 | HR 88 | Temp 98.2°F | Wt 130.0 lb

## 2014-05-01 DIAGNOSIS — K5900 Constipation, unspecified: Secondary | ICD-10-CM

## 2014-05-01 MED ORDER — LACTULOSE 10 GM/15ML PO SOLN: 10.0000 g | Freq: Every day | ORAL | Status: DC

## 2014-05-01 NOTE — Progress Notes (Signed)
   Subjective:    Patient ID: Lisa Woods, female    DOB: 1991-02-23, 23 y.o.   MRN: [id number]  HPI  Here to follow up constipation.  Seen in ED, symptoms improved with mag citrate.  Has tried ex-lax, has hemorrhoids since prior to pregnancy, worse after pregnancy (has 23 yo).  Pain is usually LUQ.  No fam hx of colon cancer.  Review of Systems  Constitutional: Negative for chills and diaphoresis.  Cardiovascular: Negative for leg swelling.  Gastrointestinal: Positive for abdominal pain, constipation and rectal pain (occasionally, none currently). Negative for diarrhea and anal bleeding.  Endocrine: Negative for cold intolerance and heat intolerance.  Neurological: Negative for headaches.       Objective:   Physical Exam  Constitutional: She appears well-developed and well-nourished. No distress.  HENT:  Mouth/Throat: Mucous membranes are moist. Pharynx is normal.  Eyes: Conjunctivae and EOM are normal.  Neck: No adenopathy.  Cardiovascular: Normal rate and S2 normal.   Pulmonary/Chest: Effort normal.  Abdominal: She exhibits no distension. There is no tenderness.  Musculoskeletal: Normal range of motion.  Neurological: She is alert. No cranial nerve deficit. Coordination normal.  Skin: Skin is warm. No rash noted. She is not diaphoretic. No pallor.          Assessment & Plan:  23 yo with constipation and hemorrhoids (asymptomatic currently) - rx lactulose as miralax was too expensive, 15mL daily with increased water intake, may titrate up to TID if needed for soft BM daily - no red flag signs (blood diarrhea, weight loss, change in BM) => do not feel need for colonoscopy at this time, should symptoms change or red flag signs will refer for colonoscopy. - increase water intake, continue fiber supplement and colace.  Perry MountACOSTA,Jamion Carter ROCIO, MD 3:50 PM

## 2014-05-01 NOTE — Patient Instructions (Addendum)
Hemorrhoids Hemorrhoids are puffy (swollen) veins around the rectum or anus. Hemorrhoids can cause pain, itching, bleeding, or irritation. HOME CARE  Eat foods with fiber, such as whole grains, beans, nuts, fruits, and vegetables. Ask your doctor about taking products with added fiber in them (fibersupplements).  Drink enough fluid to keep your pee (urine) clear or pale yellow.  Exercise often.  Go to the bathroom when you have the urge to poop. Do not wait.  Avoid straining to poop (bowel movement).  Keep the butt area dry and clean. Use wet toilet paper or moist paper towels.  Medicated creams and medicine inserted into the anus (anal suppository) may be used or applied as told.  Only take medicine as told by your doctor.  Take a warm water bath (sitz bath) for 15-20 minutes to ease pain. Do this 3-4 times a day.  Place ice packs on the area if it is tender or puffy. Use the ice packs between the warm water baths.  Put ice in a plastic bag.  Place a towel between your skin and the bag.  Leave the ice on for 15-20 minutes, 03-04 times a day.  Do not use a donut-shaped pillow or sit on the toilet for a long time. GET HELP RIGHT AWAY IF:   You have more pain that is not controlled by treatment or medicine.  You have bleeding that will not stop.  You have trouble or are unable to poop (bowel movement).  You have pain or puffiness outside the area of the hemorrhoids. MAKE SURE YOU:   Understand these instructions.  Will watch your condition.  Will get help right away if you are not doing well or get worse. Document Released: 02/19/2008 Document Revised: 04/28/2012 Document Reviewed: 03/23/2012 [facility] Patient Information 2015 [place], [place]. This information is not intended to replace advice given to you by your health care provider. Make sure you discuss any questions you have with your health care provider.   - Drink 2L water daily - Lactulose 15mL daily, may  take up to 3x daily when are constipated.  Drink at least 2 glasses of water with this medicine or you may get dehydrated. - Continue Colace

## 2014-10-12 ENCOUNTER — Encounter (HOSPITAL_COMMUNITY): Payer: Self-pay | Admitting: Emergency Medicine

## 2014-10-12 DIAGNOSIS — R51 Headache: Secondary | ICD-10-CM | POA: Diagnosis not present

## 2014-10-12 DIAGNOSIS — M791 Myalgia: Secondary | ICD-10-CM | POA: Insufficient documentation

## 2014-10-12 DIAGNOSIS — R509 Fever, unspecified: Secondary | ICD-10-CM | POA: Insufficient documentation

## 2014-10-12 DIAGNOSIS — R5383 Other fatigue: Secondary | ICD-10-CM | POA: Diagnosis not present

## 2014-10-12 DIAGNOSIS — Z720 Tobacco use: Secondary | ICD-10-CM | POA: Insufficient documentation

## 2014-10-12 LAB — COMPREHENSIVE METABOLIC PANEL
ALBUMIN: 3.8 g/dL (ref 3.5–5.0)
ALT: 24 U/L (ref 14–54)
AST: 28 U/L (ref 15–41)
Alkaline Phosphatase: 62 U/L (ref 38–126)
Anion gap: 11 (ref 5–15)
BUN: 9 mg/dL (ref 6–20)
CO2: 22 mmol/L (ref 22–32)
Calcium: 8.8 mg/dL — ABNORMAL LOW (ref 8.9–10.3)
Chloride: 101 mmol/L (ref 101–111)
Creatinine, Ser: 1.01 mg/dL — ABNORMAL HIGH (ref 0.44–1.00)
GFR calc non Af Amer: >60 mL/min (ref >60)
GLUCOSE: 118 mg/dL — AB (ref 65–99)
Potassium: 3.2 mmol/L — ABNORMAL LOW (ref 3.5–5.1)
Sodium: 134 mmol/L — ABNORMAL LOW (ref 135–145)
TOTAL PROTEIN: 7 g/dL (ref 6.5–8.1)
Total Bilirubin: 0.6 mg/dL (ref 0.3–1.2)

## 2014-10-12 LAB — CBC WITH DIFFERENTIAL/PLATELET
BASOS ABS: 0 10*3/uL (ref 0.0–0.1)
BASOS PCT: 0 % (ref 0–1)
Eosinophils Absolute: 0 10*3/uL (ref 0.0–0.7)
Eosinophils Relative: 0 % (ref 0–5)
HCT: 38.3 % (ref 36.0–46.0)
Hemoglobin: 13.3 g/dL (ref 12.0–15.0)
Lymphocytes Relative: 9 % — ABNORMAL LOW (ref 12–46)
Lymphs Abs: 0.5 10*3/uL — ABNORMAL LOW (ref 0.7–4.0)
MCH: 30 pg (ref 26.0–34.0)
MCHC: 34.7 g/dL (ref 30.0–36.0)
MCV: 86.3 fL (ref 78.0–100.0)
Monocytes Absolute: 0.4 10*3/uL (ref 0.1–1.0)
Monocytes Relative: 6 % (ref 3–12)
NEUTROS PCT: 85 % — AB (ref 43–77)
Neutro Abs: 5.3 10*3/uL (ref 1.7–7.7)
Platelets: 129 10*3/uL — ABNORMAL LOW (ref 150–400)
RBC: 4.44 MIL/uL (ref 3.87–5.11)
RDW: 12 % (ref 11.5–15.5)
WBC: 6.2 10*3/uL (ref 4.0–10.5)

## 2014-10-12 LAB — POC URINE PREG, ED: Preg Test, Ur: NEGATIVE

## 2014-10-12 MED ORDER — ACETAMINOPHEN 325 MG PO TABS
650.0000 mg | ORAL_TABLET | Freq: Once | ORAL | Status: AC
  Administered 2014-10-12: 650 mg via ORAL

## 2014-10-12 MED ORDER — ACETAMINOPHEN 325 MG PO TABS
ORAL_TABLET | ORAL | Status: DC
Start: 2014-10-12 — End: 2014-10-13
  Filled 2014-10-12: qty 2

## 2014-10-12 NOTE — ED Notes (Signed)
Pt. reports generalized body aches , fever , chills , headache and fatigue onset this week unrelieved by OTC medications .

## 2014-10-13 ENCOUNTER — Emergency Department (HOSPITAL_COMMUNITY)
Admission: EM | Admit: 2014-10-13 | Discharge: 2014-10-13 | Discharge status: Against Medical Advice | Payer: Medicaid Other | Attending: Emergency Medicine | Admitting: Emergency Medicine

## 2014-10-13 LAB — URINALYSIS, ROUTINE W REFLEX MICROSCOPIC
Glucose, UA: NEGATIVE mg/dL
KETONES UR: 15 mg/dL — AB
NITRITE: POSITIVE — AB
PH: 5.5 (ref 5.0–8.0)
Protein, ur: 30 mg/dL — AB
Specific Gravity, Urine: 1.03 (ref 1.005–1.030)
UROBILINOGEN UA: 1 mg/dL (ref 0.0–1.0)

## 2014-10-13 LAB — URINE MICROSCOPIC-ADD ON

## 2014-10-13 NOTE — ED Notes (Signed)
Called x3. No answer 

## 2014-10-13 NOTE — ED Provider Notes (Signed)
Patient left without being seen from triage. She was not seen by a provider.  Lisa MawKristen N Hyla Coard, DO 10/13/14 [phone]

## 2014-10-13 NOTE — ED Notes (Signed)
Attempted to call patient to come back to ED.  Cell phone mailbox was full, left SMS message on phone.

## 2015-03-13 ENCOUNTER — Encounter (HOSPITAL_COMMUNITY): Payer: Self-pay | Admitting: Emergency Medicine

## 2015-03-13 ENCOUNTER — Emergency Department (HOSPITAL_COMMUNITY)
Admission: EM | Admit: 2015-03-13 | Discharge: 2015-03-13 | Disposition: A | Discharge status: Home / Self Care | Payer: Medicaid Other | Attending: Emergency Medicine | Admitting: Emergency Medicine

## 2015-03-13 DIAGNOSIS — Z3202 Encounter for pregnancy test, result negative: Secondary | ICD-10-CM | POA: Insufficient documentation

## 2015-03-13 DIAGNOSIS — Z202 Contact with and (suspected) exposure to infections with a predominantly sexual mode of transmission: Secondary | ICD-10-CM

## 2015-03-13 DIAGNOSIS — Z720 Tobacco use: Secondary | ICD-10-CM | POA: Insufficient documentation

## 2015-03-13 DIAGNOSIS — Z8619 Personal history of other infectious and parasitic diseases: Secondary | ICD-10-CM | POA: Insufficient documentation

## 2015-03-13 DIAGNOSIS — N760 Acute vaginitis: Secondary | ICD-10-CM | POA: Insufficient documentation

## 2015-03-13 DIAGNOSIS — B9689 Other specified bacterial agents as the cause of diseases classified elsewhere: Secondary | ICD-10-CM

## 2015-03-13 LAB — WET PREP, GENITAL
Trich, Wet Prep: NONE SEEN
YEAST WET PREP: NONE SEEN

## 2015-03-13 LAB — POC URINE PREG, ED: PREG TEST UR: NEGATIVE

## 2015-03-13 MED ORDER — CEFTRIAXONE SODIUM 250 MG IJ SOLR
250.0000 mg | Freq: Once | INTRAMUSCULAR | Status: AC
  Administered 2015-03-13: 250 mg via INTRAMUSCULAR
  Filled 2015-03-13: qty 250

## 2015-03-13 MED ORDER — STERILE WATER FOR INJECTION IJ SOLN
INTRAMUSCULAR | Status: AC
  Administered 2015-03-13: 2.1 mL
  Filled 2015-03-13: qty 10

## 2015-03-13 MED ORDER — METRONIDAZOLE 500 MG PO TABS: 500.0000 mg | ORAL_TABLET | Freq: Two times a day (BID) | ORAL | Status: DC

## 2015-03-13 MED ORDER — AZITHROMYCIN 250 MG PO TABS
1000.0000 mg | ORAL_TABLET | Freq: Once | ORAL | Status: AC
  Administered 2015-03-13: 1000 mg via ORAL
  Filled 2015-03-13: qty 4

## 2015-03-13 NOTE — ED Notes (Signed)
Per pt, states she wants to be checked for STDs-states vaginal discharge for 2-3 months

## 2015-03-13 NOTE — Discharge Instructions (Signed)
Bacterial Vaginosis Use protection when having sexual intercourse.  Take medication as prescribed.  Follow up with women's outpatient clinic. The hospital will call you to inform you if you have an STD.  Bacterial vaginosis is a vaginal infection that occurs when the normal balance of bacteria in the vagina is disrupted. It results from an overgrowth of certain bacteria. This is the most common vaginal infection in women of childbearing age. Treatment is important to prevent complications, especially in pregnant women, as it can cause a premature delivery. CAUSES  Bacterial vaginosis is caused by an increase in harmful bacteria that are normally present in smaller amounts in the vagina. Several different kinds of bacteria can cause bacterial vaginosis. However, the reason that the condition develops is not fully understood. RISK FACTORS Certain activities or behaviors can put you at an increased risk of developing bacterial vaginosis, including:  Having a new sex partner or multiple sex partners.  Douching.  Using an intrauterine device (IUD) for contraception. Women do not get bacterial vaginosis from toilet seats, bedding, swimming pools, or contact with objects around them. SIGNS AND SYMPTOMS  Some women with bacterial vaginosis have no signs or symptoms. Common symptoms include:  Grey vaginal discharge.  A fishlike odor with discharge, especially after sexual intercourse.  Itching or burning of the vagina and vulva.  Burning or pain with urination. DIAGNOSIS  Your health care provider will take a medical history and examine the vagina for signs of bacterial vaginosis. A sample of vaginal fluid may be taken. Your health care provider will look at this sample under a microscope to check for bacteria and abnormal cells. A vaginal pH test may also be done.  TREATMENT  Bacterial vaginosis may be treated with antibiotic medicines. These may be given in the form of a pill or a vaginal cream.  A second round of antibiotics may be prescribed if the condition comes back after treatment. Because bacterial vaginosis increases your risk for sexually transmitted diseases, getting treated can help reduce your risk for chlamydia, gonorrhea, HIV, and herpes. HOME CARE INSTRUCTIONS   Only take over-the-counter or prescription medicines as directed by your health care provider.  If antibiotic medicine was prescribed, take it as directed. Make sure you finish it even if you start to feel better.  Tell all sexual partners that you have a vaginal infection. They should see their health care provider and be treated if they have problems, such as a mild rash or itching.  During treatment, it is important that you follow these instructions:  Avoid sexual activity or use condoms correctly.  Do not douche.  Avoid alcohol as directed by your health care provider.  Avoid breastfeeding as directed by your health care provider. SEEK MEDICAL CARE IF:   Your symptoms are not improving after 3 days of treatment.  You have increased discharge or pain.  You have a fever. MAKE SURE YOU:   Understand these instructions.  Will watch your condition.  Will get help right away if you are not doing well or get worse. FOR MORE INFORMATION  Centers for Disease Control and Prevention, Division of STD Prevention: SolutionApps.co.za American Sexual Health Association (ASHA): www.ashastd.org    This information is not intended to replace advice given to you by your health care provider. Make sure you discuss any questions you have with your health care provider.   Document Released: 05/12/2005 Document Revised: 06/02/2014 Document Reviewed: 12/22/2012 Elsevier Interactive Patient Education 2016 [employer].  Sexually Transmitted Disease A  sexually transmitted disease (STD) is a disease or infection often passed to another person during sex. However, STDs can be passed through nonsexual ways. An STD can be  passed through:  Spit (saliva).  Semen.  Blood.  Mucus from the vagina.  Pee (urine). HOW CAN I LESSEN MY CHANCES OF GETTING AN STD?  Use:  Latex condoms.  Water-soluble lubricants with condoms. Do not use petroleum jelly or oils.  Dental dams. These are small pieces of latex that are used as a barrier during oral sex.  Avoid having more than one sex partner.  Do not have sex with someone who has other sex partners.  Do not have sex with anyone you do not know or who is at high risk for an STD.  Avoid risky sex that can break your skin.  Do not have sex if you have open sores on your mouth or skin.  Avoid drinking too much alcohol or taking illegal drugs. Alcohol and drugs can affect your good judgment.  Avoid oral and anal sex acts.  Get shots (vaccines) for HPV and hepatitis.  If you are at risk of being infected with HIV, it is advised that you take a certain medicine daily to prevent HIV infection. This is called pre-exposure prophylaxis (PrEP). You may be at risk if:  You are a man who has sex with other men (MSM).  You are attracted to the opposite sex (heterosexual) and are having sex with more than one partner.  You take drugs with a needle.  You have sex with someone who has HIV.  Talk with your doctor about if you are at high risk of being infected with HIV. If you begin to take PrEP, get tested for HIV first. Get tested every 3 months for as long as you are taking PrEP.  Get tested for STDs every year if you are sexually active. If you are treated for an STD, get tested again 3 months after you are treated. WHAT SHOULD I DO IF I THINK I HAVE AN STD?  See your doctor.  Tell your sex partner(s) that you have an STD. They should be tested and treated.  Do not have sex until your doctor says it is okay. WHEN SHOULD I GET HELP? Get help right away if:  You have bad belly (abdominal) pain.  You are a man and have puffiness (swelling) or pain in your  testicles.  You are a woman and have puffiness in your vagina.   This information is not intended to replace advice given to you by your health care provider. Make sure you discuss any questions you have with your health care provider.   Document Released: 06/19/2004 Document Revised: 06/02/2014 Document Reviewed: 11/05/2012 Elsevier Interactive Patient Education [employer] Elsevier Inc.

## 2015-03-13 NOTE — ED Notes (Signed)
AVS explained in detail. Pt knows to take entire Flagyl regimen. No other c/c. Pt e-signed- pad does not work. No other c/c. Knows to follow up with health department if needed.

## 2015-03-13 NOTE — ED Provider Notes (Signed)
CSN: [id number]     Arrival date & time 03/13/15  1848 History   None    Chief Complaint  Patient presents with  . STD check/vaginal discharge      (Consider location/radiation/quality/duration/timing/severity/associated sxs/prior Treatment) The history is provided by the patient. No language interpreter was used.  Ms. Laural BenesJohnson is a 24 y.o female with a history of chlamydia who states she is here to be checked for STDs. She states she has had mucousy, white, malodorous vaginal discharge for the past month. She has also had 3 sexual partners in the last 6 months. Her last measure. Was one month ago and she denies being on birth control. She states she has had chlamydia in the past when she was 24 years old. She is also requesting to be tested for syphilis and HIV. She denies any fever, abdominal pain, nausea, vomiting, vaginal bleeding, dysuria, hematuria, or urinary frequency.  Past Medical History  Diagnosis Date  . Chlamydia    History reviewed. No pertinent past surgical history. Family History  Problem Relation Age of Onset  . Cancer Mother     Breast Cancer   Social History  Substance Use Topics  . Smoking status: Current Some Day Smoker -- 0.25 packs/day    Types: Cigarettes  . Smokeless tobacco: None     Comment: only smokes 1 cig a day  . Alcohol Use: No   OB History    Gravida Para Term Preterm AB TAB SAB Ectopic Multiple Living   1 1 1       1      Review of Systems  Constitutional: Negative for fever.  Gastrointestinal: Negative for nausea, vomiting and abdominal pain.  Genitourinary: Positive for vaginal discharge. Negative for frequency, hematuria, flank pain, vaginal bleeding, menstrual problem and pelvic pain.  All other systems reviewed and are negative.     Allergies  Review of patient's allergies indicates no known allergies.  Home Medications   Prior to Admission medications   Medication Sig Start Date End Date Taking? Authorizing Provider   cephALEXin (KEFLEX) 500 MG capsule Take 1 capsule (500 mg total) by mouth 3 (three) times daily. Patient not taking: Reported on 04/25/2014 11/15/13   Garlon HatchetLisa M Sanders, PA-C  docusate sodium (COLACE) 100 MG capsule Take 1 capsule (100 mg total) by mouth every 12 (twelve) hours. Patient not taking: Reported on 03/13/2015 04/25/14   Oswaldo ConroyVictoria Creech, PA-C  lactulose [facility](CHRONULAC) 10 GM/15ML solution Take 15 mLs (10 g total) by mouth daily. Patient not taking: Reported on 03/13/2015 05/01/14   Fredirick LatheKristy Acosta, MD  metroNIDAZOLE (FLAGYL) 500 MG tablet Take 1 tablet (500 mg total) by mouth 2 (two) times daily. 03/13/15   Keimon Basaldua Patel-Mills, PA-C   BP 112/61 mmHg  Pulse 71  Temp(Src) 98.5 F (36.9 C) (Oral)  Resp 15  SpO2 100%  LMP 02/20/2015 Physical Exam  Constitutional: She is oriented to person, place, and time. She appears well-developed and well-nourished.  HENT:  Head: Normocephalic and atraumatic.  Eyes: Conjunctivae are normal.  Neck: Normal range of motion. Neck supple.  Cardiovascular: Normal rate, regular rhythm and normal heart sounds.   Pulmonary/Chest: Effort normal and breath sounds normal.  Abdominal: Soft. Normal appearance. She exhibits no distension. There is no tenderness. There is no rebound and no guarding.  Abdomen is soft and nontender. Normal appearance. No guarding or rebound. No distention.  Genitourinary: Pelvic exam was performed with patient supine. No tenderness in the vagina. Vaginal discharge found.  Pelvic exam: Chaperone present.  Cervical os is closed. No adnexal tenderness. Thick white nonodorous vaginal discharge. No vaginal bleeding. No external vesicles.  Musculoskeletal: Normal range of motion.  Neurological: She is alert and oriented to person, place, and time.  Skin: Skin is warm and dry.  Nursing note and vitals reviewed.   ED Course  Procedures (including critical care time) Labs Review Labs Reviewed  WET PREP, GENITAL - Abnormal; Notable for the  following:    Clue Cells Wet Prep HPF POC FEW (*)    WBC, Wet Prep HPF POC FEW (*)    All other components within normal limits  RPR  HIV ANTIBODY (ROUTINE TESTING)  POC URINE PREG, ED  POC URINE PREG, ED  GC/CHLAMYDIA PROBE AMP ([place]) NOT AT [facility]    Imaging Review No results found. I have personally reviewed and evaluated these lab results as part of my medical decision-making.   EKG Interpretation None      MDM   Final diagnoses:  Possible exposure to STD  Bacterial vaginosis   Patient presents for STD check and vaginal discharge for 1 month.  She has a few clue cells on wet prep.  Given her symptoms I treated her for bacterial vaginosis. I also treated her for possible exposure to STDs. I explained that the hospital would call her if the STD results were positive and would treat her at that time. I gave her follow-up with women's outpatient clinic and discussed return precautions with her. I also discussed using safe sex practices. Patient verbally agrees with the plan.  Medications  cefTRIAXone (ROCEPHIN) injection 250 mg (250 mg Intramuscular Given 03/13/15 2041)  azithromycin (ZITHROMAX) tablet 1,000 mg (1,000 mg Oral Given 03/13/15 2041)  sterile water (preservative free) injection (2.1 mLs  Given 03/13/15 2043)     Catha Gosselin, PA-C 03/13/15 6213  Jerelyn Scott, MD 03/13/15 2328

## 2015-03-14 LAB — GC/CHLAMYDIA PROBE AMP (~~LOC~~) NOT AT ARMC
Chlamydia: POSITIVE — AB
Neisseria Gonorrhea: NEGATIVE

## 2015-03-14 LAB — HIV ANTIBODY (ROUTINE TESTING W REFLEX): HIV Screen 4th Generation wRfx: NONREACTIVE

## 2015-03-14 LAB — RPR: RPR Ser Ql: NONREACTIVE

## 2015-03-15 ENCOUNTER — Telehealth (HOSPITAL_BASED_OUTPATIENT_CLINIC_OR_DEPARTMENT_OTHER): Payer: Self-pay | Admitting: Emergency Medicine

## 2015-04-13 ENCOUNTER — Encounter: Payer: Medicaid Other | Admitting: Obstetrics and Gynecology

## 2015-08-03 ENCOUNTER — Encounter: Payer: Self-pay | Admitting: Family Medicine

## 2015-08-03 ENCOUNTER — Ambulatory Visit (INDEPENDENT_AMBULATORY_CARE_PROVIDER_SITE_OTHER): Payer: Medicaid Other | Admitting: Family Medicine

## 2015-08-03 VITALS — BP 115/69 | HR 79 | Temp 98.2°F | Wt 125.0 lb

## 2015-08-03 DIAGNOSIS — Z30013 Encounter for initial prescription of injectable contraceptive: Secondary | ICD-10-CM | POA: Diagnosis not present

## 2015-08-03 LAB — POCT URINE PREGNANCY: Preg Test, Ur: NEGATIVE

## 2015-08-03 MED ORDER — MEDROXYPROGESTERONE ACETATE 150 MG/ML IM SUSP
150.0000 mg | Freq: Once | INTRAMUSCULAR | Status: AC
  Administered 2015-08-03: 150 mg via INTRAMUSCULAR

## 2015-08-07 NOTE — Progress Notes (Signed)
Patient ID: Lisa Woods, female   DOB: 15-Oct-1990, 25 y.o.   MRN: [id number]   [facility] Yolande Jolly, MD Phone: [phone]  Subjective:   # Routine Follow up  - pt. Here for follow up.  - No issues in the interim since last office visit.  - Has been doing well.  - Does not drink, smoke tobacco or use illicit substances.  - In a stable relationship, enjoys taking care of her young daughter.   # HCM  - Last Pap smear in 2015 normal.  - Chlamydia screening 2016 positive. No TOC yet.  - TDAP up to date.  - HIV screening negative 02/2015.  - Denies flu vaccine  # Contraception  - pt. Would like to begin contraception.  - She is interested in the depo progesterone shot.  - She has considered LARC's, but says that she does not want anything implanted in her body. Discussed at length.  - LMP 2 weeks ago. Have been regular but of varying degrees of length and amount of bleeding ever since she first had her period.  - She says that she is smoking around 2-3 cigarettes / day. We discussed the small but very real risk of DVT and PE with this. She acknowledges this risk and says that she will stop smoking.  - She also says that she was recently treated for STD at outside hospital, she is not sure which one.  - She is currently taking antibiotics for this.  - Would like to come back for vaginal exam and testing for cure.  - Denies risky sexual contact, does not know where the infection came from. Says her partner was not treated for Chlamydia when she was diagnosed before. Believes he is monogamous.   All relevant systems were reviewed and were negative unless otherwise noted in the HPI  Past Medical History Reviewed problem list.  Medications- reviewed and updated Current Outpatient Prescriptions  Medication Sig Dispense Refill  . cephALEXin (KEFLEX) 500 MG capsule Take 1 capsule (500 mg total) by mouth 3 (three) times daily. (Patient not taking: Reported on  04/25/2014) 21 capsule 0  . docusate sodium (COLACE) 100 MG capsule Take 1 capsule (100 mg total) by mouth every 12 (twelve) hours. (Patient not taking: Reported on 03/13/2015) 60 capsule 0  . lactulose (CHRONULAC) 10 GM/15ML solution Take 15 mLs (10 g total) by mouth daily. (Patient not taking: Reported on 03/13/2015) 240 mL 2  . metroNIDAZOLE (FLAGYL) 500 MG tablet Take 1 tablet (500 mg total) by mouth 2 (two) times daily. 14 tablet 0   No current facility-administered medications for this visit.   Chief complaint-noted No additions to family history Social history- patient is a 2-3 cigarette / day smoker  Objective: BP 115/69 mmHg  Pulse 79  Temp(Src) 98.2 F (36.8 C) (Oral)  Wt 125 lb (56.7 kg)  LMP 07/31/2015 (Approximate) Gen: NAD, alert, cooperative with exam HEENT: NCAT, EOMI, PERRL Neck: FROM, supple CV: RRR, good S1/S2, no murmur Resp: CTABL, no wheezes, non-labored Abd: SNTND, BS present, no guarding or organomegaly Ext: No edema, warm, normal tone, moves UE/LE spontaneously Neuro: Alert and oriented, No gross deficits Skin: no rashes no lesions  Assessment/Plan:  # HCM  - Needs Pap next year - Needs Chlamydia test of cure.  - Needs flu shot if she will take it.   # Contraception  - pt. To get depo progesterone today.  - Negative preg test today.  - Warned extensively against  smoking and being on birth control. Less risk with progesterone, but still some risk. She acknowledges.  - needs f/u vaginal exam and STD test of cure 1 month.

## 2015-09-20 ENCOUNTER — Telehealth: Payer: Self-pay | Admitting: Obstetrics and Gynecology

## 2015-09-20 NOTE — Telephone Encounter (Signed)
Need to speak with nurse regarding having constant vaginal bleeding after receiving last depo shot

## 2015-09-20 NOTE — Telephone Encounter (Signed)
Returned patient call Patient states she restarted depo provera injections for contraception last month (08/03/15) C/o vaginal bleeding since receiving injection States she would like to speak to a female doctor regarding this Appt given for first available with female physician 09/21/15 at 2:45 pm Note from 08/03/15 states patient is in need of vaginal exam and STD test of cure  DUCATTE, Nickola MajorLAURENZE L, RN

## 2015-09-21 ENCOUNTER — Ambulatory Visit: Payer: Medicaid Other | Admitting: Family Medicine

## 2015-12-28 ENCOUNTER — Ambulatory Visit (INDEPENDENT_AMBULATORY_CARE_PROVIDER_SITE_OTHER): Payer: Medicaid Other | Admitting: Obstetrics and Gynecology

## 2015-12-28 ENCOUNTER — Encounter: Payer: Self-pay | Admitting: Obstetrics and Gynecology

## 2015-12-28 ENCOUNTER — Other Ambulatory Visit (HOSPITAL_COMMUNITY)
Admission: RE | Admit: 2015-12-28 | Discharge: 2015-12-28 | Disposition: A | Discharge status: Home / Self Care | Payer: Medicaid Other | Source: Ambulatory Visit | Attending: Family Medicine | Admitting: Family Medicine

## 2015-12-28 VITALS — BP 106/71 | HR 99 | Wt 137.0 lb

## 2015-12-28 DIAGNOSIS — N898 Other specified noninflammatory disorders of vagina: Secondary | ICD-10-CM

## 2015-12-28 DIAGNOSIS — Z0000 Encounter for general adult medical examination without abnormal findings: Secondary | ICD-10-CM

## 2015-12-28 DIAGNOSIS — Z113 Encounter for screening for infections with a predominantly sexual mode of transmission: Secondary | ICD-10-CM | POA: Diagnosis present

## 2015-12-28 DIAGNOSIS — R21 Rash and other nonspecific skin eruption: Secondary | ICD-10-CM

## 2015-12-28 DIAGNOSIS — Z3042 Encounter for surveillance of injectable contraceptive: Secondary | ICD-10-CM

## 2015-12-28 LAB — POCT WET PREP (WET MOUNT)
Clue Cells Wet Prep Whiff POC: NEGATIVE
Trichomonas Wet Prep HPF POC: ABSENT

## 2015-12-28 LAB — POCT URINE PREGNANCY: PREG TEST UR: NEGATIVE

## 2015-12-28 MED ORDER — KETOCONAZOLE 2 % EX CREA: 1.0000 "application " | TOPICAL_CREAM | Freq: Two times a day (BID) | CUTANEOUS | 0 refills | Status: DC

## 2015-12-28 MED ORDER — FLUCONAZOLE 150 MG PO TABS
150.0000 mg | ORAL_TABLET | Freq: Once | ORAL | 0 refills | Status: AC
Start: 2015-12-28 — End: 2015-12-28

## 2015-12-28 MED ORDER — MEDROXYPROGESTERONE ACETATE 150 MG/ML IM SUSP
150.0000 mg | Freq: Once | INTRAMUSCULAR | Status: AC
  Administered 2015-12-28: 150 mg via INTRAMUSCULAR

## 2015-12-28 NOTE — Patient Instructions (Addendum)
Wet prep came back showing yeast. Medication sent to pharmacy Will call about other results Sent in an antifungal creme to pharmacy. Return to clinic if rash not improved in 2 weeks.  Received depo today. Next shot due between Oct 20-Nov. 3.   Body Ringworm Ringworm (tinea corporis) is a fungal infection of the skin on the body. This infection is not caused by worms, but is actually caused by a fungus. Fungus normally lives on the top of your skin and can be useful. However, in the case of ringworms, the fungus grows out of control and causes a skin infection. It can involve any area of skin on the body and can spread easily from one person to another (contagious). Ringworm is a common problem for children, but it can affect adults as well. Ringworm is also often found in athletes, especially wrestlers who share equipment and mats.  CAUSES  Ringworm of the body is caused by a fungus called dermatophyte. It can spread by:  Touchingother people who are infected.  Touchinginfected pets.  Touching or sharingobjects that have been in contact with the infected person or pet (hats, combs, towels, clothing, sports equipment). SYMPTOMS   Itchy, raised red spots and bumps on the skin.  Ring-shaped rash.  Redness near the border of the rash with a clear center.  Dry and scaly skin on or around the rash. Not every person develops a ring-shaped rash. Some develop only the red, scaly patches. DIAGNOSIS  Most often, ringworm can be diagnosed by performing a skin exam. Your caregiver may choose to take a skin scraping from the affected area. The sample will be examined under the microscope to see if the fungus is present.  TREATMENT  Body ringworm may be treated with a topical antifungal cream or ointment. Sometimes, an antifungal shampoo that can be used on your body is prescribed. You may be prescribed antifungal medicines to take by mouth if your ringworm is severe, keeps coming back, or lasts a  long time.  HOME CARE INSTRUCTIONS   Only take over-the-counter or prescription medicines as directed by your caregiver.  Wash the infected area and dry it completely before applying yourcream or ointment.  When using antifungal shampoo to treat the ringworm, leave the shampoo on the body for 3-5 minutes before rinsing.   Wear loose clothing to stop clothes from rubbing and irritating the rash.  Wash or change your bed sheets every night while you have the rash.  Have your pet treated by your veterinarian if it has the same infection. To prevent ringworm:   Practice good hygiene.  Wear sandals or shoes in public places and showers.  Do not share personal items with others.  Avoid touching red patches of skin on other people.  Avoid touching pets that have bald spots or wash your hands after doing so. SEEK MEDICAL CARE IF:   Your rash continues to spread after 7 days of treatment.  Your rash is not gone in 4 weeks.  The area around your rash becomes red, warm, tender, and swollen.   This information is not intended to replace advice given to you by your health care provider. Make sure you discuss any questions you have with your health care provider.   Document Released: 05/09/2000 Document Revised: 02/04/2012 Document Reviewed: 11/24/2011 Elsevier Interactive Patient Education [employer]

## 2015-12-28 NOTE — Progress Notes (Signed)
Subjective: Chief Complaint  Patient presents with  . Annual Exam  . Rash     HPI: Lisa Woods is a 25 y.o. female presents for well woman/preventative visit.   Acute Concerns:  #Rash 2 weeks in duration. Hyperpigmented patches on lower legs. Dry and itchy. Started as bunch of bumps. Denies fever, N/V, chest pain, SOB, myalgia, erythema, swelling. Feels it may be improving some but not resolved.  -tried cortisone 10, calamine lotion, benadryl without relief.   #Vaginal Discharge Reports that has had white discharge for about 2 weeks. Has had unprotected intercourse with partner of 2 years. Denies burning, itching, erythema, swelling, changes in urinary habits. Denies odor or itching. No new medications. Of note, has history of chlamydia and BV.  Diet: Eats out a lot. Processed foods. States she knows diet needs to be better. Smme fruits and vegetables but not daily.   Exercise: Dances at home for fun but otherwise denies exercise.   Sexual History: sexually active with partner of 2 years Birth history: G1P1001  LMP: Reports spotting while on depo shot  Birth Control: On depo. Been >3 months since last administration. Has had unprotected intercourse. Due for a new injection.    Social:  Social History   Social History  . Marital status: Single    Spouse name: N/A  . Number of children: N/A  . Years of education: N/A   Social History Main Topics  . Smoking status: Current Some Day Smoker    Packs/day: 0.25    Types: Cigars  . Smokeless tobacco: Never Used     Comment: only smokes 1 cig a day. Black and mild.  . Alcohol use Yes     Comment: occasional   . Drug use: No  . Sexual activity: Yes    Birth control/ protection: Injection   Other Topics Concern  . None   Social History Narrative   Lives with boyfriend's mother, her boyfriend and their baby. Graduated high school, currently not working. 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     Immunization:  Tdap/TD: Up to Date  Influenza: Due this fall  Cancer Screening:  Pap Smear: Up to Date   ROS reviewed and were negative unless otherwise noted in HPI.   Past Medical, Surgical, Social, and Family History Reviewed & Updated per EMR.  Objective: BP 106/71   Pulse 99   Wt 137 lb (62.1 kg)   LMP 11/27/2015 Comment: currently on depo  BMI 22.80 kg/m  Vitals and nursing notes reviewed  Physical Exam Constitutional: She appears well-developed and well-nourished. No distress.  HENT:  Head: Normocephalic and atraumatic.  Mouth/Throat: Oropharynx is clear and moist.  Eyes: EOM are normal. Pupils are equal, round, and reactive to light. No scleral icterus.  Cardiovascular: Normal rate and regular rhythm.   No murmur heard. Pulmonary/Chest: Effort normal and breath sounds normal. She has no wheezes.  Abdominal: Soft. Bowel sounds are normal. She exhibits no distension and no mass. There is no tenderness.  Genitourinary: Vaginal discharge found.  Musculoskeletal: She exhibits no edema or tenderness.  Skin: Rash noted. No erythema.  Bilaterally hyperpigmented patches with scale, intermittently dispersed from posterior of kness down to dorsum of feet.    Results for orders placed or performed in visit on 12/28/15 (from the past 72 hour(s))  POCT Wet Prep [facility])     Status: Abnormal   Collection Time: 12/28/15 11:30 AM  Result Value Ref Range   Source Wet Prep POC  VAG    WBC, Wet Prep HPF POC 10-20    Bacteria Wet Prep HPF POC Few None, Few, Too numerous to count   Clue Cells Wet Prep HPF POC None None, Too numerous to count   Clue Cells Wet Prep Whiff POC Negative Whiff    Yeast Wet Prep HPF POC Many    Trichomonas Wet Prep HPF POC Absent Absent  POCT urine pregnancy     Status: None   Collection Time: 12/28/15 11:50 AM  Result Value Ref Range   Preg Test, Ur Negative Negative    Assessment/Plan: 25 y.o. female presents for annual well  woman/preventative exam and GYN exam. Please see problem specific assessment and plan.     Orders Placed This Encounter  Procedures  . POCT Wet Prep [employer])  . POCT urine pregnancy    Meds ordered this encounter  Medications  . ketoconazole (NIZORAL) 2 % cream    Sig: Apply 1 application topically 2 (two) times daily.    Dispense:  60 g    Refill:  0  . fluconazole (DIFLUCAN) 150 MG tablet    Sig: Take 1 tablet (150 mg total) by mouth once.    Dispense:  1 tablet    Refill:  0  . medroxyPROGESTERone (DEPO-PROVERA) injection 150 mg     Caryl Ada, DO 12/28/2015, 11:24 AM PGY-3, [facility]

## 2015-12-30 ENCOUNTER — Encounter: Payer: Self-pay | Admitting: Obstetrics and Gynecology

## 2015-12-30 DIAGNOSIS — Z3490 Encounter for supervision of normal pregnancy, unspecified, unspecified trimester: Secondary | ICD-10-CM | POA: Insufficient documentation

## 2015-12-30 DIAGNOSIS — R21 Rash and other nonspecific skin eruption: Secondary | ICD-10-CM | POA: Insufficient documentation

## 2015-12-30 NOTE — Assessment & Plan Note (Signed)
Pap done in 2015. Due in 2018.  Counseled on returning to get flu vaccine in fall.  Handout given

## 2015-12-30 NOTE — Assessment & Plan Note (Signed)
Urine pregnancy negative. Depo given today. Next due Oct 20-Nov 3. Smoking cessation discussed with patient.

## 2015-12-30 NOTE — Assessment & Plan Note (Signed)
Consistent with tinea infection of skin. Has not improved with steroid cream. Rx for antifungal given. Return precautions dicussed.If not improved at next visit would consider skin scrapping and punch biopsy.

## 2015-12-30 NOTE — Assessment & Plan Note (Addendum)
Wet prep with many yeast. Rx for Fluconazole . STD testing also provided; will contact patient about results.

## 2015-12-31 LAB — CERVICOVAGINAL ANCILLARY ONLY
CHLAMYDIA, DNA PROBE: NEGATIVE
Neisseria Gonorrhea: NEGATIVE

## 2016-01-01 ENCOUNTER — Encounter: Payer: Self-pay | Admitting: *Deleted

## 2016-01-01 ENCOUNTER — Telehealth: Payer: Self-pay

## 2016-01-01 NOTE — Telephone Encounter (Signed)
LMRTC

## 2016-01-01 NOTE — Telephone Encounter (Signed)
-----   Message from Jazma Y Phelps, DO sent at 01/01/2016  8:32 AM EDT ----- Called to let patient know STD testing was negative but no answer. Only abnormal result was yeast infection for which she was aware of prior to leaving clinic and medication was sent to pharmacy. Please inform patient if she calls back. Thank you. 

## 2016-01-01 NOTE — Telephone Encounter (Signed)
Pt informed and agreeable. Andres Vest Dawn, CMA   

## 2016-01-01 NOTE — Telephone Encounter (Signed)
-----   Message from Pincus LargeJazma Y Phelps, DO sent at 01/01/2016  8:32 AM EDT ----- Called to let patient know STD testing was negative but no answer. Only abnormal result was yeast infection for which she was aware of prior to leaving clinic and medication was sent to pharmacy. Please inform patient if she calls back. Thank you.

## 2016-01-01 NOTE — Telephone Encounter (Signed)
Letter mailed to patient with normal results. Jazmin Hartsell,CMA  

## 2016-01-31 ENCOUNTER — Ambulatory Visit (INDEPENDENT_AMBULATORY_CARE_PROVIDER_SITE_OTHER): Payer: Medicaid Other | Admitting: Obstetrics and Gynecology

## 2016-01-31 ENCOUNTER — Encounter: Payer: Self-pay | Admitting: Obstetrics and Gynecology

## 2016-01-31 VITALS — BP 124/75 | HR 79 | Temp 98.3°F | Wt 138.0 lb

## 2016-01-31 DIAGNOSIS — L02213 Cutaneous abscess of chest wall: Secondary | ICD-10-CM | POA: Diagnosis not present

## 2016-01-31 MED ORDER — SULFAMETHOXAZOLE-TRIMETHOPRIM 800-160 MG PO TABS: 1.0000 | ORAL_TABLET | Freq: Two times a day (BID) | ORAL | 0 refills | Status: AC

## 2016-01-31 NOTE — Patient Instructions (Signed)

## 2016-01-31 NOTE — Progress Notes (Signed)
Lisa Woods is a 25 year old female who presents today for pain under her right breast. It is been present for approximately 1 week. It is getting a little bit worse. She states she had a similar lesion between her breasts sometime ago that drained on its own. She states this one has not drained at all. It is painful when she wears her bra. She has no recollection of trauma to this area. She denies history of MRSA infection. She denies fevers or chills denies nausea or vomiting.    Review of Systems  Constitutional: Negative for chills and fever.  Respiratory: Negative for cough and sputum production.   Cardiovascular: Negative for chest pain and palpitations.  Gastrointestinal: Negative for nausea and vomiting.  Skin: Positive for itching and rash.  Neurological: Negative for dizziness and tingling.   Vitals:   01/31/16 0935  BP: 124/75  Pulse: 79  Temp: 98.3 F (36.8 C)    Physical Exam  Constitutional: She is oriented to person, place, and time and well-developed, well-nourished, and in no distress.  Cardiovascular: Normal rate.   Pulmonary/Chest: No respiratory distress.  Neurological: She is alert and oriented to person, place, and time.  Skin: Skin is warm and dry. She is not diaphoretic.     Psychiatric: Affect and judgment normal.  Vitals reviewed.    A/P:  Cutaneous abscess of chest wall  Patient with abscess under her left breast. Small area of fluctuance noted. Offered lancing with scalpel today patient declined. Given a prescription for 7 days of Bactrim given abscess formation some concern for MRSA infection. Advised patient if she does not have significant improvement by Monday she should return to the office for I&D. Patient voiced understanding.

## 2016-02-01 IMAGING — DX DG ABDOMEN 1V
1 series · 1 of 1 positions shown · non-contrast
Comparison: None.

CLINICAL DATA: 23-year-old female with abdominal pain. History of
constipation.

EXAM:
ABDOMEN - 1 VIEW

[abdomen supine]
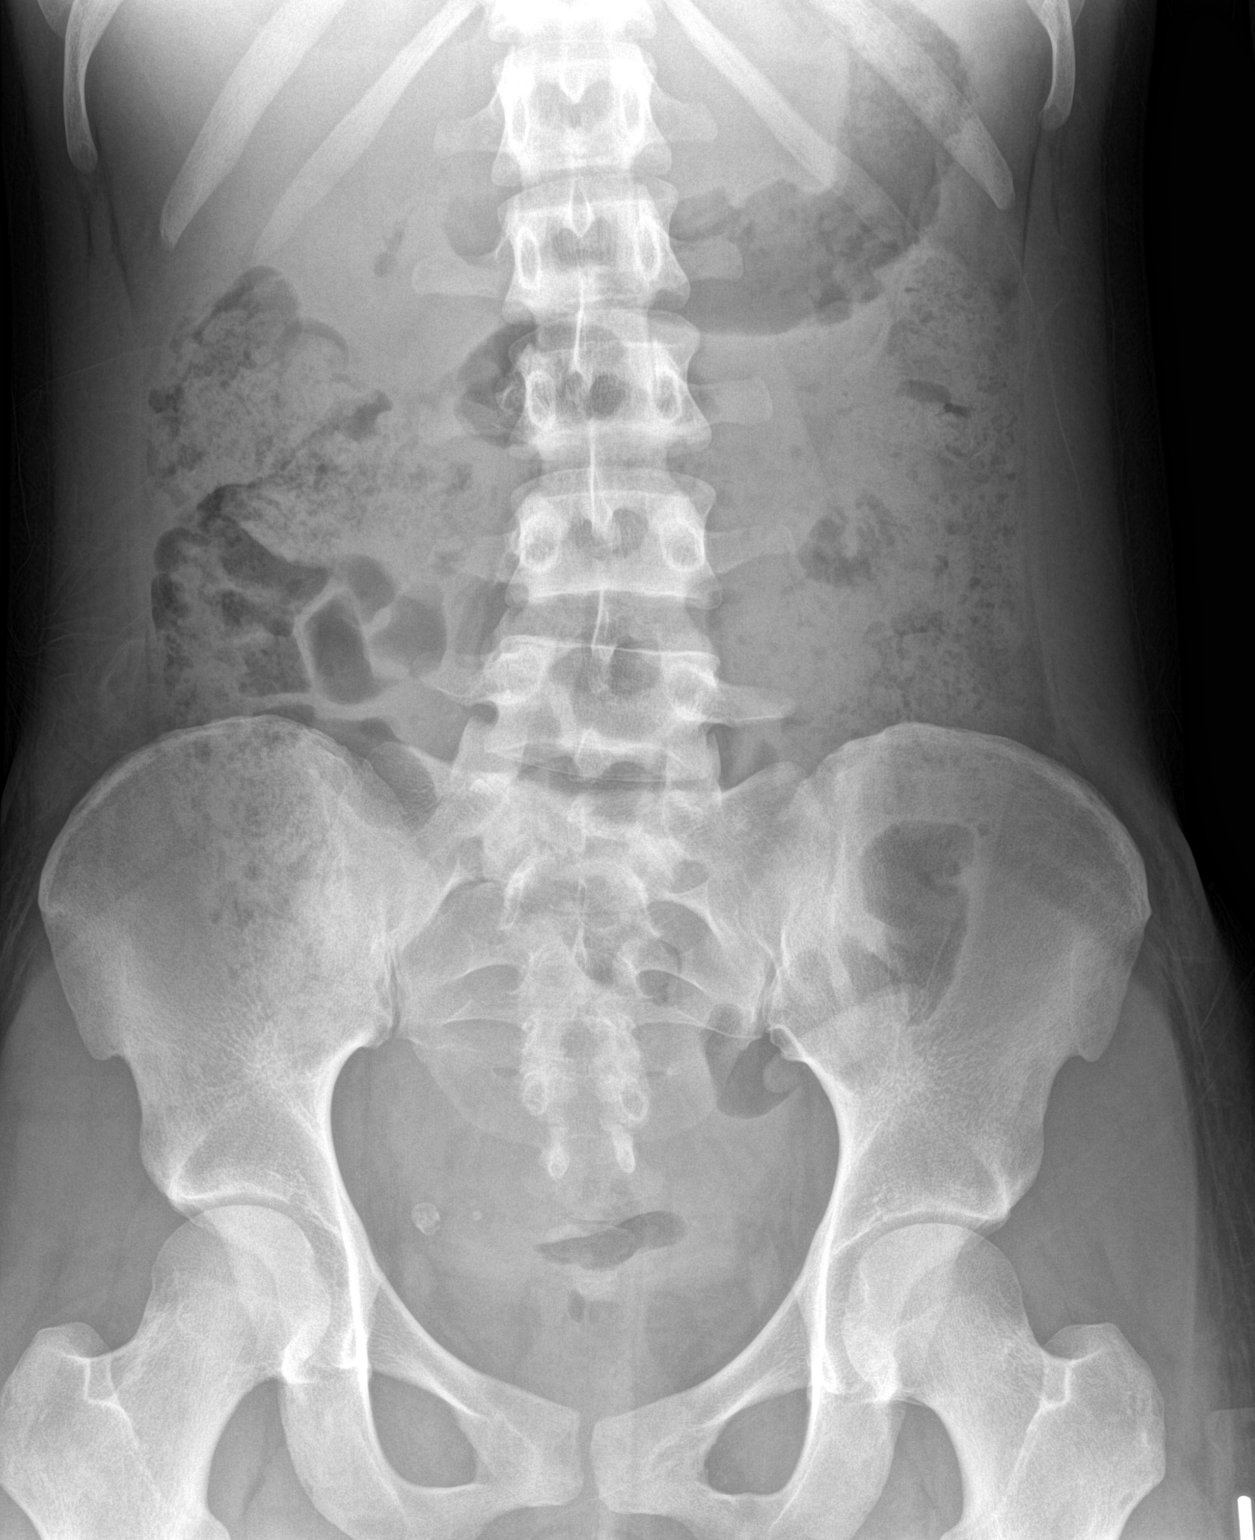

[1 of 1 positions shown; findings below may reference images not displayed]

FINDINGS: No abnormally distended small bowel or colon.

Small amount of centralized bowel gas.

No unexpected radiopaque foreign body.

No unexpected calcifications.

No evidence of organomegaly.

No displaced fracture.

Pelvic phleboliths. Mild apex left curvature of the lumbar spine,
potentially positional. Likely partial lumbarization of the first
sacral element.

Moderate to advanced stool burden, with stool involving the
ascending colon, hepatic flexure and transverse colon, splenic
flexure and descending colon.
IMPRESSION: Large stool burden, without evidence of obstruction.

## 2016-04-16 ENCOUNTER — Telehealth: Payer: Self-pay | Admitting: Internal Medicine

## 2016-04-16 ENCOUNTER — Other Ambulatory Visit (HOSPITAL_COMMUNITY)
Admission: RE | Admit: 2016-04-16 | Discharge: 2016-04-16 | Disposition: A | Discharge status: Home / Self Care | Payer: Medicaid Other | Source: Ambulatory Visit | Attending: Family Medicine | Admitting: Family Medicine

## 2016-04-16 ENCOUNTER — Encounter: Payer: Self-pay | Admitting: Internal Medicine

## 2016-04-16 ENCOUNTER — Ambulatory Visit (INDEPENDENT_AMBULATORY_CARE_PROVIDER_SITE_OTHER): Payer: Medicaid Other | Admitting: Internal Medicine

## 2016-04-16 VITALS — BP 100/62 | HR 92 | Temp 98.3°F | Ht 65.0 in | Wt 140.0 lb

## 2016-04-16 DIAGNOSIS — Z113 Encounter for screening for infections with a predominantly sexual mode of transmission: Secondary | ICD-10-CM

## 2016-04-16 DIAGNOSIS — R300 Dysuria: Secondary | ICD-10-CM | POA: Diagnosis not present

## 2016-04-16 DIAGNOSIS — N898 Other specified noninflammatory disorders of vagina: Secondary | ICD-10-CM | POA: Diagnosis not present

## 2016-04-16 DIAGNOSIS — Z3042 Encounter for surveillance of injectable contraceptive: Secondary | ICD-10-CM | POA: Diagnosis not present

## 2016-04-16 DIAGNOSIS — N939 Abnormal uterine and vaginal bleeding, unspecified: Secondary | ICD-10-CM

## 2016-04-16 DIAGNOSIS — N920 Excessive and frequent menstruation with regular cycle: Secondary | ICD-10-CM | POA: Diagnosis not present

## 2016-04-16 LAB — POCT URINALYSIS DIPSTICK
BILIRUBIN UA: NEGATIVE
Glucose, UA: NEGATIVE
KETONES UA: NEGATIVE
LEUKOCYTES UA: NEGATIVE
Nitrite, UA: NEGATIVE
PROTEIN UA: NEGATIVE
SPEC GRAV UA: 1.02
Urobilinogen, UA: 0.2
pH, UA: 7

## 2016-04-16 LAB — POCT UA - MICROSCOPIC ONLY

## 2016-04-16 LAB — POCT WET PREP (WET MOUNT)
CLUE CELLS WET PREP WHIFF POC: POSITIVE
TRICHOMONAS WET PREP HPF POC: ABSENT

## 2016-04-16 LAB — POCT URINE PREGNANCY: PREG TEST UR: NEGATIVE

## 2016-04-16 MED ORDER — METRONIDAZOLE 500 MG PO TABS: 500.0000 mg | ORAL_TABLET | Freq: Two times a day (BID) | ORAL | 0 refills | Status: DC

## 2016-04-16 MED ORDER — MEDROXYPROGESTERONE ACETATE 150 MG/ML IM SUSY
150.0000 mg | PREFILLED_SYRINGE | Freq: Once | INTRAMUSCULAR | Status: AC
  Administered 2016-04-16: 150 mg via INTRAMUSCULAR

## 2016-04-16 NOTE — Telephone Encounter (Signed)
Called patient to report of BV. Sent in Flagyl 500mg  BID x 7 days. Patient understood

## 2016-04-16 NOTE — Progress Notes (Signed)
   [facility] Phone: [phone]   Date of Visit: 04/16/2016   HPI: Patient reports of having vaginal discharge x 1 week - brow-red discharge with odor but also reports of spotting as well.  - no pelvic pain or abdominal pain  - reports of some dysuria as well. No flank pain. No abdominal pain. No fevers/chills Does have history of UTI - is sexually active with one partner for 4 years   - reports of inconsistent use of condoms - is on the depo but last shot was 12/28/15 and was due for shot on 10/20 to 11/3 but missed this. She has been having unprotected intercourse. She is interested in getting a longer lasting contraception option. - history of chlamydia about 3 years ago  ROS: See HPI.  PHYSICAL EXAM: BP 100/62   Pulse 92   Temp 98.3 F (36.8 C) (Oral)   Ht 5\' 5"  (1.651 m)   Wt 140 lb (63.5 kg)   SpO2 98%   BMI 23.30 kg/m  GEN: NAD   CV: RRR, no murmurs, rubs, or gallops PULM: CTAB, normal effort ABD: Soft, nontender, nondistended, NABS, no organomegaly GU: Female genitalia: normal external genitalia, vulva, vagina, cervix, uterus and adnexa. Light red blood in vaginal vault (minimal). SKIN: No rash or cyanosis; warm and well-perfused EXTR: No lower extremity edema or calf tenderness PSYCH: Mood and affect euthymic, normal rate and volume of speech NEURO: Awake, alert, no focal deficits grossly, normal speech  ASSESSMENT/PLAN: Vaginal Discharge/spotting: Spotting is likely due to patient being late for her depo provera dose. Urine pregnancy test negative. UA with small blood (likely from the spotting) but no signs of infection. Would likely benefit from repeat UA later on to ensure RBC has resolved. No significant discharge noted on speculum exam. Wet prep with many clue cells and positive whiff test, therefore will treat for bacterial vaginosis with Flagyl 500mg  BID x 7 days; discussed with patient regarding abstinence from alcohol during treatment  and up to three days after last dose due to adverse effects. Will test for Gc/chlamydia, HIV and RPR(patient preference).  Palma HolterKanishka G Gunadasa, MD PGY 2 [facility]

## 2016-04-16 NOTE — Progress Notes (Signed)
Pt is late for depo, she was due Mar 14, 2016 - Mar 28, 2016.  She has had unprotected sex in the past two weeks.   Advised that she should still use condoms for one week and that pregnancy test may not be conclusive due to dating.   She expressed understanding and wants depo anyways.  She will be due again Jul 02, 2016 - Jul 18, 2016, but plans to make appt in 2 weeks to [facility].  She will need a 2nd pregnancy test at that time [facility] to [facility]. Lindzee Gouge, Maryjo RochesterJessica Dawn, CMA

## 2016-04-16 NOTE — Patient Instructions (Signed)
I will call you with your results

## 2016-04-17 LAB — HIV ANTIBODY (ROUTINE TESTING W REFLEX): HIV: NONREACTIVE

## 2016-04-17 LAB — RPR

## 2016-04-23 ENCOUNTER — Encounter: Payer: Self-pay | Admitting: Internal Medicine

## 2016-04-23 LAB — GC/CHLAMYDIA PROBE AMP (~~LOC~~) NOT AT ARMC
Chlamydia: NEGATIVE
NEISSERIA GONORRHEA: NEGATIVE

## 2016-04-23 NOTE — Progress Notes (Signed)
Sent letter regarding lab results

## 2016-07-30 ENCOUNTER — Other Ambulatory Visit (HOSPITAL_COMMUNITY)
Admission: RE | Admit: 2016-07-30 | Discharge: 2016-07-30 | Disposition: A | Discharge status: Home / Self Care | Payer: Medicaid Other | Source: Ambulatory Visit | Attending: Family Medicine | Admitting: Family Medicine

## 2016-07-30 ENCOUNTER — Encounter: Payer: Self-pay | Admitting: Student

## 2016-07-30 ENCOUNTER — Ambulatory Visit (INDEPENDENT_AMBULATORY_CARE_PROVIDER_SITE_OTHER): Payer: Medicaid Other | Admitting: Student

## 2016-07-30 VITALS — BP 110/64 | HR 75 | Temp 97.6°F | Wt 149.0 lb

## 2016-07-30 DIAGNOSIS — Z30430 Encounter for insertion of intrauterine contraceptive device: Secondary | ICD-10-CM | POA: Diagnosis not present

## 2016-07-30 DIAGNOSIS — Z01419 Encounter for gynecological examination (general) (routine) without abnormal findings: Secondary | ICD-10-CM | POA: Diagnosis present

## 2016-07-30 DIAGNOSIS — Z113 Encounter for screening for infections with a predominantly sexual mode of transmission: Secondary | ICD-10-CM | POA: Insufficient documentation

## 2016-07-30 DIAGNOSIS — Z124 Encounter for screening for malignant neoplasm of cervix: Secondary | ICD-10-CM

## 2016-07-30 DIAGNOSIS — N912 Amenorrhea, unspecified: Secondary | ICD-10-CM | POA: Diagnosis not present

## 2016-07-30 DIAGNOSIS — N898 Other specified noninflammatory disorders of vagina: Secondary | ICD-10-CM

## 2016-07-30 DIAGNOSIS — Z0000 Encounter for general adult medical examination without abnormal findings: Secondary | ICD-10-CM

## 2016-07-30 DIAGNOSIS — W57XXXA Bitten or stung by nonvenomous insect and other nonvenomous arthropods, initial encounter: Secondary | ICD-10-CM | POA: Insufficient documentation

## 2016-07-30 LAB — POCT WET PREP (WET MOUNT)
Clue Cells Wet Prep Whiff POC: POSITIVE
TRICHOMONAS WET PREP HPF POC: ABSENT

## 2016-07-30 LAB — POCT URINE PREGNANCY: Preg Test, Ur: NEGATIVE

## 2016-07-30 MED ORDER — METRONIDAZOLE 500 MG PO TABS: 500.0000 mg | ORAL_TABLET | Freq: Two times a day (BID) | ORAL | 0 refills | Status: DC

## 2016-07-30 NOTE — Assessment & Plan Note (Signed)
Pap today, will follow 

## 2016-07-30 NOTE — Assessment & Plan Note (Signed)
Rash consistent with bed bugs - discussed ways to eradicate bed bug infestation including washing clothes and bed linens in hot water - will follow

## 2016-07-30 NOTE — Assessment & Plan Note (Signed)
Wet prep and GC/CT done - wet prep showed BV, will treat - will follow GC/CT and treat as needed

## 2016-07-30 NOTE — Progress Notes (Signed)
   Subjective:    Patient ID: Lisa Woods, female    DOB: 24-Oct-1990, 26 y.o.   MRN: [id number]   CC: bug bites, contraception  HPI: 26 y/o F presents for concern for bug bites and desire for contraception  Bug bites - have noted them for the last month - she sleeps mainly on her sofa which she got used from a friend - she has not noted similar bites on her daughter who lives with her  Contraception - has been on depo but is later for her injection - she does not recall her LMP - reports she does not like depo and is interested in [place] - she is sexually active with one female partner - she denies vaginal irritation, dysuria, current abdominal pain  Smoking status reviewed  Review of Systems  Per HPI, else denies recent illness, chest pain, shortness of breath    Objective:  BP 110/64   Pulse 75   Temp 97.6 F (36.4 C) (Oral)   Wt 149 lb (67.6 kg)   LMP  (LMP Unknown)   SpO2 99%   BMI 24.79 kg/m  Vitals and nursing note reviewed  General: NAD Cardiac: RRR, Respiratory: CTAB, normal effort Skin: papular bumps in linear formation  noted on ankles, wrists and chest otherwise warm and dry, Neuro: alert and oriented, no focal deficits  Pelvic: Normal EGBUS, normal vaginal, normal cervix with no CMT, normal mobile uterus, normal adnexa with no masses, no adnexal tenderness     Assessment & Plan:    Contraception management Mirena placed today. See procedure note  Vaginal discharge Wet prep and GC/CT done - wet prep showed BV, will treat - will follow GC/CT and treat as needed  Healthcare maintenance Pap today, will follow   Bed bug bite Rash consistent with bed bugs - discussed ways to eradicate bed bug infestation including washing clothes and bed linens in hot water - will follow    Lorice Lafave A. Kennon RoundsHaney MD, MS Family Medicine Resident PGY-3 Pager [phone]

## 2016-07-30 NOTE — Patient Instructions (Signed)
Follow up in 4 weeks for string check If you have any questions or concerns, call the office at [phone]

## 2016-07-30 NOTE — Progress Notes (Signed)
GYNECOLOGY CLINIC PROCEDURE NOTE  Lisa Woods is a 26 y.o. G1P1001 here for Mirena IUD insertion. No GYN concerns.  Last pap smear was on 06/15/2013 and was normal.  IUD Insertion Procedure Note Patient identified, informed consent performed.  Discussed risks of irregular bleeding, cramping, infection, malpositioning or misplacement of the IUD outside the uterus which may require further procedure such as laparoscopy. Time out was performed.  Urine pregnancy test negative.  Speculum placed in the vagina.  Cervix visualized.  Cleaned with Betadine x 2.  Grasped anteriorly with a single tooth tenaculum.  Uterus sounded to 8 cm.  Mirena IUD placed per manufacturer's recommendations.  Strings trimmed to 3 cm. Tenaculum was removed, good hemostasis noted.  Patient tolerated procedure well.   Patient was given post-procedure instructions.  She was advised to be have backup contraception for one week.  Patient was also asked to check IUD strings periodically and follow up in 4 weeks for IUD check.    Melroy Bougher A. Kennon RoundsHaney MD, MS Family Medicine Resident PGY-3 Pager [phone]

## 2016-07-30 NOTE — Assessment & Plan Note (Signed)
Mirena placed today. See procedure note

## 2016-07-31 DIAGNOSIS — N912 Amenorrhea, unspecified: Secondary | ICD-10-CM | POA: Diagnosis not present

## 2016-07-31 MED ORDER — LEVONORGESTREL 20 MCG/24HR IU IUD
INTRAUTERINE_SYSTEM | Freq: Once | INTRAUTERINE | Status: AC
  Administered 2016-07-31: 10:00:00 1 via INTRAUTERINE

## 2016-07-31 NOTE — Addendum Note (Signed)
Addended by: Jone BasemanFLEEGER, Mae Cianci D on: 07/31/2016 10:12 AM   Modules accepted: Orders

## 2016-08-01 LAB — CERVICOVAGINAL ANCILLARY ONLY
Chlamydia: NEGATIVE
Neisseria Gonorrhea: NEGATIVE

## 2016-08-06 ENCOUNTER — Encounter: Payer: Self-pay | Admitting: Student

## 2016-08-06 LAB — CYTOLOGY - PAP: DIAGNOSIS: NEGATIVE

## 2016-08-18 ENCOUNTER — Ambulatory Visit: Payer: Medicaid Other | Admitting: Obstetrics and Gynecology

## 2016-09-15 ENCOUNTER — Ambulatory Visit (HOSPITAL_COMMUNITY)
Admission: EM | Admit: 2016-09-15 | Discharge: 2016-09-15 | Disposition: A | Discharge status: Home / Self Care | Payer: Medicaid Other | Attending: Family Medicine | Admitting: Family Medicine

## 2016-09-15 ENCOUNTER — Encounter (HOSPITAL_COMMUNITY): Payer: Self-pay | Admitting: Emergency Medicine

## 2016-09-15 DIAGNOSIS — Z975 Presence of (intrauterine) contraceptive device: Secondary | ICD-10-CM

## 2016-09-15 DIAGNOSIS — Z113 Encounter for screening for infections with a predominantly sexual mode of transmission: Secondary | ICD-10-CM | POA: Diagnosis present

## 2016-09-15 DIAGNOSIS — N898 Other specified noninflammatory disorders of vagina: Secondary | ICD-10-CM | POA: Insufficient documentation

## 2016-09-15 DIAGNOSIS — F17290 Nicotine dependence, other tobacco product, uncomplicated: Secondary | ICD-10-CM | POA: Insufficient documentation

## 2016-09-15 MED ORDER — METRONIDAZOLE 500 MG PO TABS: 500.0000 mg | ORAL_TABLET | Freq: Two times a day (BID) | ORAL | 0 refills | Status: DC

## 2016-09-15 NOTE — Discharge Instructions (Signed)
You are being tested for Gonorrhea, chlamydia, trichomonas, BV, and yeast candidiasis. Should any of these be positive, we will contact you in 3-5 business days and provide instructions as to appropriate follow up and management. To cover for possible BV based on your discharge today, I have sent a prescription of metronidazole to your pharmacy. It is 1 tablet twice a day for 7 days. Avoid any alcohol while taking. I recommend using condoms or abstinence while being treated.

## 2016-09-15 NOTE — ED Triage Notes (Signed)
The patient presented to the [facility] with a complaint of a vaginal discharge since she got her IUD. The patient also requested STD check.

## 2016-09-15 NOTE — ED Provider Notes (Signed)
CSN: [id number]     Arrival date & time 09/15/16  1609 History   First MD Initiated Contact with Patient 09/15/16 1659     Chief Complaint  Patient presents with  . Vaginal Discharge   (Consider location/radiation/quality/duration/timing/severity/associated sxs/prior Treatment) 26 year old female presents to clinic with chief complaint of vaginal discharge. She was treated by her gynecologist 2 months ago for BV. The symptoms have been nearly continuous since having her Mirena inserted. She is sexually active, however insists on strict condom use. She does wish to be checked for STDs.    Vaginal Discharge  Quality:  Thin, watery and white Severity:  Mild Onset quality:  Gradual Duration:  2 months Timing:  Constant Progression:  Unchanged Chronicity:  Recurrent Relieved by:  Prescription medications Worsened by:  Nothing Associated symptoms: no abdominal pain, no dyspareunia, no dysuria, no genital lesions, no nausea, no rash, no urinary frequency, no vaginal itching and no vomiting   Risk factors: no new sexual partner, no STI exposure and no unprotected sex     Past Medical History:  Diagnosis Date  . Chlamydia    History reviewed. No pertinent surgical history. Family History  Problem Relation Age of Onset  . Cancer Mother     Breast Cancer   Social History  Substance Use Topics  . Smoking status: Current Some Day Smoker    Packs/day: 0.25    Types: Cigars  . Smokeless tobacco: Never Used     Comment: only smokes 1 cig a day. Black and mild.  . Alcohol use Yes     Comment: occasional    OB History    Gravida Para Term Preterm AB Living   SAB TAB Ectopic Multiple Live Births           1     Review of Systems  Constitutional: Negative.   HENT: Negative.   Respiratory: Negative.   Cardiovascular: Negative.   Gastrointestinal: Negative for abdominal pain, nausea and vomiting.  Genitourinary: Positive for vaginal discharge. Negative for  dyspareunia, dysuria, frequency and menstrual problem.  Musculoskeletal: Negative.   Skin: Negative.   Neurological: Negative.   All other systems reviewed and are negative.   Allergies  Patient has no known allergies.  Home Medications   Prior to Admission medications   Medication Sig Start Date End Date Taking? Authorizing Provider  metroNIDAZOLE (FLAGYL) 500 MG tablet Take 1 tablet (500 mg total) by mouth 2 (two) times daily. 09/15/16   Dorena Bodo, NP   Meds Ordered and Administered this Visit  Medications - No data to display  BP 117/63 (BP Location: Right Arm)   Pulse 74   Temp 98.2 F (36.8 C) (Oral)   Resp 18   SpO2 100%  No data found.   Physical Exam  Constitutional: She is oriented to person, place, and time. She appears well-developed and well-nourished. No distress.  HENT:  Head: Normocephalic and atraumatic.  Right Ear: External ear normal.  Left Ear: External ear normal.  Eyes: Conjunctivae are normal. Right eye exhibits no discharge. Left eye exhibits no discharge.  Neurological: She is alert and oriented to person, place, and time.  Skin: Skin is warm and dry. Capillary refill takes less than 2 seconds. No rash noted. She is not diaphoretic. No erythema.  Psychiatric: She has a normal mood and affect. Her behavior is normal.  Nursing note and vitals reviewed.   Urgent Care Course  Procedures (including critical care time)  Labs Review Labs Reviewed  URINE CYTOLOGY ANCILLARY ONLY    Imaging Review No results found.    MDM   1. Screening for STD (sexually transmitted disease)     Patient declined pelvic exam as due to having her daughter with her at this visit. Urine collected and sent to lab for cytology. Being tested for gonorrhea, chlamydia, Trichomonas, Gardnerella, and yeast candidiasis. Treating presumptively for BV with metronidazole, notify the patient 3-5 business days if there or any other positives findings on her  test.     Dorena Bodo, NP 09/15/16 1746

## 2016-09-16 LAB — URINE CYTOLOGY ANCILLARY ONLY
CHLAMYDIA, DNA PROBE: NEGATIVE
NEISSERIA GONORRHEA: NEGATIVE
TRICH (WINDOWPATH): POSITIVE — AB

## 2016-09-18 LAB — URINE CYTOLOGY ANCILLARY ONLY: Candida vaginitis: NEGATIVE

## 2016-09-23 ENCOUNTER — Ambulatory Visit: Payer: Medicaid Other | Admitting: Obstetrics and Gynecology

## 2016-09-29 ENCOUNTER — Ambulatory Visit: Payer: Medicaid Other | Admitting: Obstetrics and Gynecology

## 2016-11-06 ENCOUNTER — Ambulatory Visit: Payer: Medicaid Other | Admitting: Student

## 2016-11-06 ENCOUNTER — Other Ambulatory Visit (HOSPITAL_COMMUNITY)
Admission: RE | Admit: 2016-11-06 | Discharge: 2016-11-06 | Disposition: A | Discharge status: Home / Self Care | Payer: Medicaid Other | Source: Ambulatory Visit | Attending: Family Medicine | Admitting: Family Medicine

## 2016-11-06 ENCOUNTER — Encounter: Payer: Self-pay | Admitting: Student

## 2016-11-06 ENCOUNTER — Ambulatory Visit (INDEPENDENT_AMBULATORY_CARE_PROVIDER_SITE_OTHER): Payer: Medicaid Other | Admitting: Student

## 2016-11-06 VITALS — BP 110/62 | HR 88 | Temp 98.8°F | Ht 65.0 in | Wt 144.6 lb

## 2016-11-06 DIAGNOSIS — Z113 Encounter for screening for infections with a predominantly sexual mode of transmission: Secondary | ICD-10-CM

## 2016-11-06 DIAGNOSIS — Z202 Contact with and (suspected) exposure to infections with a predominantly sexual mode of transmission: Secondary | ICD-10-CM

## 2016-11-06 DIAGNOSIS — Z9229 Personal history of other drug therapy: Secondary | ICD-10-CM | POA: Diagnosis not present

## 2016-11-06 LAB — POCT WET PREP (WET MOUNT)
CLUE CELLS WET PREP WHIFF POC: POSITIVE
Trichomonas Wet Prep HPF POC: ABSENT

## 2016-11-06 MED ORDER — METRONIDAZOLE 500 MG PO TABS: 500.0000 mg | ORAL_TABLET | Freq: Two times a day (BID) | ORAL | 0 refills | Status: DC

## 2016-11-06 NOTE — Patient Instructions (Signed)
Follow up as needed You will be called about your lab results Return tomorrow for TB testing then again on Monday 6/18 to have it checked Please call the office at [phone] with questions or concerns

## 2016-11-06 NOTE — Assessment & Plan Note (Signed)
Benign exam - RPR, HIV, Wet prep, GC/Ct today - wet prep showed BV, will treat with flagyl

## 2016-11-06 NOTE — Progress Notes (Signed)
   Subjective:    Patient ID: Lisa Woods, female    DOB: 04-19-1991, 26 y.o.   MRN: [id number]   CC: IUD string check, STD check  HPI: 26 y/o F presents for IUD check and STD check  IUD - IUD placed on 07/30/2016 - she has tolerated it well, no abdominal pain - she does not do her own string checks  STD check - diagnosed with trichomonas recently and treated at urgent care - she presents for further STD testing - she has not had had intercourse with her previous partner since - no vaginal irritation, discharge, abdominal pain, dysuria or fevers  Going to [employer]NA school - needs MMR immunity testing, varicella testing, TB testing  Smoking status reviewed  Review of Systems  Per HPI, else denies  chest pain, shortness of breath,     Objective:  BP 110/62   Pulse 88   Temp 98.8 F (37.1 C) (Oral)   Ht '5\' 5"'$  (1.651 m)   Wt 144 lb 9.6 oz (65.6 kg)   SpO2 94%   BMI 24.06 kg/m  Vitals and nursing note reviewed  General: NAD Cardiac: RRR, Respiratory: CTAB, normal effort Abdomen: soft, nontender, nondistended,  Extremities: no edema or cyanosis. WWP. 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 Lincoln Brigham MD, [facility] Resident PGY-3 Pager [phone]

## 2016-11-06 NOTE — Assessment & Plan Note (Signed)
IUD strings in place.

## 2016-11-07 LAB — MEASLES/MUMPS/RUBELLA IMMUNITY
MUMPS ABS, IGG: 25.6 AU/mL (ref >10.9)
Rubella Antibodies, IGG: 2.88 index (ref >0.99)

## 2016-11-07 LAB — CERVICOVAGINAL ANCILLARY ONLY
CHLAMYDIA, DNA PROBE: NEGATIVE
NEISSERIA GONORRHEA: NEGATIVE

## 2016-11-10 LAB — VARICELLA ZOSTER ABS, IGG/IGM
Varicella IgM: 0.95 index — ABNORMAL HIGH (ref 0.00–0.90)
Varicella zoster IgG: 177 index (ref >165)

## 2016-11-10 LAB — HIV ANTIBODY (ROUTINE TESTING W REFLEX): HIV SCREEN 4TH GENERATION: NONREACTIVE

## 2016-11-10 LAB — RPR: RPR Ser Ql: NONREACTIVE

## 2016-11-13 ENCOUNTER — Telehealth: Payer: Self-pay | Admitting: Student

## 2016-11-13 DIAGNOSIS — Z021 Encounter for pre-employment examination: Secondary | ICD-10-CM

## 2016-11-13 NOTE — Telephone Encounter (Signed)
Please inform the patient that her HIV and RPR were negative

## 2016-11-13 NOTE — Telephone Encounter (Signed)
Patient is aware of results and needs a future order placed for urine drug screen for school.  Jazmin Hartsell,CMA

## 2016-11-13 NOTE — Telephone Encounter (Signed)
Pt contacted and VM was left informing her test was ordered and to call back to make a lab apt.

## 2016-11-13 NOTE — Telephone Encounter (Signed)
Urine drug test ordered

## 2016-11-17 ENCOUNTER — Ambulatory Visit (INDEPENDENT_AMBULATORY_CARE_PROVIDER_SITE_OTHER): Payer: Medicaid Other | Admitting: *Deleted

## 2016-11-17 DIAGNOSIS — Z111 Encounter for screening for respiratory tuberculosis: Secondary | ICD-10-CM | POA: Diagnosis present

## 2016-11-17 DIAGNOSIS — Z021 Encounter for pre-employment examination: Secondary | ICD-10-CM

## 2016-11-17 NOTE — Progress Notes (Signed)
   PPD placed Left Forearm.  Pt to return 11/19/16 for reading.  Pt tolerated intradermal injection. Clovis PuMartin, Adlean Hardeman L, RN

## 2016-11-19 ENCOUNTER — Ambulatory Visit (INDEPENDENT_AMBULATORY_CARE_PROVIDER_SITE_OTHER): Payer: Medicaid Other | Admitting: *Deleted

## 2016-11-19 ENCOUNTER — Encounter: Payer: Self-pay | Admitting: *Deleted

## 2016-11-19 DIAGNOSIS — Z111 Encounter for screening for respiratory tuberculosis: Secondary | ICD-10-CM

## 2016-11-19 LAB — TB SKIN TEST
Induration: 0 mm
TB Skin Test: NEGATIVE

## 2016-11-19 NOTE — Progress Notes (Signed)
   PPD Reading Note PPD read and results entered in EpicCare. Result: 0 mm induration. Interpretation: Negative If test not read within 48-72 hours of initial placement, patient advised to repeat in other arm 1-3 weeks after this test. Allergic reaction: no  Zaniya Mcaulay L, RN  

## 2016-11-23 LAB — TOXASSURE SELECT 13 (MW), URINE

## 2016-11-25 ENCOUNTER — Telehealth: Payer: Self-pay | Admitting: Family Medicine

## 2016-11-25 NOTE — Telephone Encounter (Signed)
No Flagyl cannot do that. If this is a drug test for employment I think we need to send this result in. If their policy is repeat in 2 weeks can do that but I highly doubt that. Thanks.  Dolores PattyAngela Hughey Rittenberry, DO PGY-2, [facility] 11/25/2016 1:35 PM

## 2016-11-25 NOTE — Telephone Encounter (Signed)
Pt contacted and informed of positive THC result. Pt would like to come back in two weeks to have a repeat test. If this is appropriate, please place future lab order and I will call to schedule.

## 2016-11-25 NOTE — Telephone Encounter (Signed)
Pt has some questions regarding her drug test. She was taking an antibotic for Bv and wandered if that could affect the resuls.  Pt will get out of class at 1:30 and could take the call after that time

## 2016-11-25 NOTE — Telephone Encounter (Signed)
LM for patient to call me back.  Breland Trouten,CMA  

## 2016-11-25 NOTE — Telephone Encounter (Signed)
Will forward to MD to advise. Nisreen Guise,CMA  

## 2016-11-25 NOTE — Telephone Encounter (Signed)
Pt would like someone to call her with her drug test results. ep

## 2017-05-29 ENCOUNTER — Other Ambulatory Visit: Payer: Self-pay

## 2017-05-29 ENCOUNTER — Encounter: Payer: Self-pay | Admitting: Family Medicine

## 2017-05-29 ENCOUNTER — Ambulatory Visit (INDEPENDENT_AMBULATORY_CARE_PROVIDER_SITE_OTHER): Payer: Medicaid Other | Admitting: Family Medicine

## 2017-05-29 ENCOUNTER — Ambulatory Visit: Payer: Self-pay | Admitting: Family Medicine

## 2017-05-29 ENCOUNTER — Other Ambulatory Visit (HOSPITAL_COMMUNITY)
Admission: RE | Admit: 2017-05-29 | Discharge: 2017-05-29 | Disposition: A | Discharge status: Home / Self Care | Payer: Medicaid Other | Source: Ambulatory Visit | Attending: Family Medicine | Admitting: Family Medicine

## 2017-05-29 VITALS — BP 110/70 | HR 90 | Temp 98.3°F | Wt 140.0 lb

## 2017-05-29 DIAGNOSIS — Z113 Encounter for screening for infections with a predominantly sexual mode of transmission: Secondary | ICD-10-CM | POA: Diagnosis not present

## 2017-05-29 DIAGNOSIS — Z30431 Encounter for routine checking of intrauterine contraceptive device: Secondary | ICD-10-CM

## 2017-05-29 DIAGNOSIS — N898 Other specified noninflammatory disorders of vagina: Secondary | ICD-10-CM

## 2017-05-29 LAB — POCT WET PREP (WET MOUNT)
CLUE CELLS WET PREP WHIFF POC: POSITIVE
Trichomonas Wet Prep HPF POC: ABSENT

## 2017-05-29 MED ORDER — METRONIDAZOLE 500 MG PO TABS: 500.0000 mg | ORAL_TABLET | Freq: Two times a day (BID) | ORAL | 0 refills | Status: DC

## 2017-05-29 NOTE — Patient Instructions (Addendum)
  [facility] [address], [place], [place] [address]  Boric acid suppositories   For prevention of recurrent BV- insert 1 capsule boric acid into vaginal canal weekly.  We will contact you with your lab results  If you have questions or concerns please do not hesitate to call at [phone].  Dolores PattyAngela Riccio, DO PGY-2, [facility] 05/29/2017 11:20 AM

## 2017-05-29 NOTE — Progress Notes (Signed)
    Subjective:    Patient ID: Lisa Woods, female    DOB: 07-29-1990, 27 y.o.   MRN: [id number]   CC: pelvic pain  Patient endorses pelvic pain since getting IUD placed last spring. She also endorses discharge that is green and foul smelling. She reports frequent BV infections. She is sexually active with multiple female partners, reports always using a condom. She would like to be checked for STDs today. She endorses the pelvic pain is low "where the IUD is" and comes intermittently, feels like cramping, occurs every day, lasts a few minutes at the time. Worse with certain movements like moving hips. Nothing seems to make it better. She is also concerned because she often goes months without a period and may only have light spotting, which is worrisome to her. She denies urinary symptoms, fevers, chills. She has 1 child, does not plan for pregnancy in the near future. Was on depo in past and disliked it. Was on pills prior to having daughter and would consider taking these in the future.  Smoking status reviewed- smokes 1 cigar daily  Review of Systems- see HPI   Objective:  BP 110/70   Pulse 90   Temp 98.3 F (36.8 C) (Oral)   Wt 140 lb (63.5 kg)   LMP 03/26/2017   SpO2 91%   BMI 23.30 kg/m  Vitals and nursing note reviewed  General: well nourished, in no acute distress Cardiac: RRR, clear S1 and S2, no murmurs, rubs, or gallops Respiratory: clear to auscultation bilaterally, no increased work of breathing Abdomen: soft, nontender, nondistended, no masses or organomegaly. Bowel sounds present GU: normal female external genitalia. Moderate amount of green discharge present. IUD strings visualized from cervical os. No CMT. No adnexal masses appreciated. Extremities: no edema or cyanosis Skin: warm and dry, no rashes noted Neuro: alert and oriented, no focal deficits   Assessment & Plan:    Vaginal discharge  Wet prep positive for BV. 7 day course of Flagyl given. Discussed  boric acid suppositories weekly for BV suppression with patient. Discussed that pelvic pain she is experiencing may well be due to recurrent BV and not the IUD. Patient verbalized understanding and agreement with plan.  -HIV, RPR, G/C pending, will call patient w/ results  Contraception management  Patient requesting IUD removal due to intermittent pelvic pain and lack of periods. Reassurance provided regarding spotting and absent periods on IUD. Discussed possibility that pelvic pain may not be related to IUD. Patient is agreeable to giving IUD more time. Asked her to follow up in 3-4 months or sooner if needed to discuss removing IUD. Patient would like to go on OCP's if she does discontinue IUD in future.    Return in about 3 months (around 08/27/2017), or if symptoms worsen or fail to improve.   Dolores PattyAngela Helma Argyle, DO Family Medicine Resident PGY-2

## 2017-05-29 NOTE — Assessment & Plan Note (Addendum)
  Wet prep positive for BV. 7 day course of Flagyl given. Discussed boric acid suppositories weekly for BV suppression with patient. Discussed that pelvic pain she is experiencing may well be due to recurrent BV and not the IUD. Patient verbalized understanding and agreement with plan.  -HIV, RPR, G/C pending, will call patient w/ results

## 2017-05-29 NOTE — Assessment & Plan Note (Signed)
  Patient requesting IUD removal due to intermittent pelvic pain and lack of periods. Reassurance provided regarding spotting and absent periods on IUD. Discussed possibility that pelvic pain may not be related to IUD. Patient is agreeable to giving IUD more time. Asked her to follow up in 3-4 months or sooner if needed to discuss removing IUD. Patient would like to go on OCP's if she does discontinue IUD in future.

## 2017-05-30 LAB — HIV ANTIBODY (ROUTINE TESTING W REFLEX): HIV Screen 4th Generation wRfx: NONREACTIVE

## 2017-05-30 LAB — RPR: RPR Ser Ql: NONREACTIVE

## 2017-06-01 LAB — CERVICOVAGINAL ANCILLARY ONLY
Chlamydia: POSITIVE — AB
Neisseria Gonorrhea: NEGATIVE

## 2017-06-02 ENCOUNTER — Other Ambulatory Visit: Payer: Self-pay | Admitting: Family Medicine

## 2017-06-02 ENCOUNTER — Ambulatory Visit (INDEPENDENT_AMBULATORY_CARE_PROVIDER_SITE_OTHER): Payer: Medicaid Other | Admitting: *Deleted

## 2017-06-02 ENCOUNTER — Telehealth: Payer: Self-pay

## 2017-06-02 DIAGNOSIS — A749 Chlamydial infection, unspecified: Secondary | ICD-10-CM | POA: Diagnosis present

## 2017-06-02 MED ORDER — AZITHROMYCIN 250 MG PO TABS: 1000.0000 mg | ORAL_TABLET | Freq: Once | ORAL | Status: DC

## 2017-06-02 MED ORDER — AZITHROMYCIN 500 MG PO TABS
1000.0000 mg | ORAL_TABLET | Freq: Once | ORAL | Status: AC
  Administered 2017-06-02: 1000 mg via ORAL

## 2017-06-02 NOTE — Telephone Encounter (Signed)
Order placed

## 2017-06-02 NOTE — Telephone Encounter (Signed)
-----   Message from Tillman SersAngela C Riccio, DO sent at 06/01/2017  4:14 PM EST ----- Please inform Ms. Bailon of positive chlamydia result. She can come to clinic for treatment or go to health department. Please let her know her partner should be treated as well. Abstain from intercourse for 7 days after treatment. Thank you.   Dolores PattyAngela Riccio, DO PGY-2, [facility] 06/01/2017 4:14 PM

## 2017-06-02 NOTE — Patient Instructions (Signed)
Chlamydia, Female Chlamydia is an STD (sexually transmitted disease). This is an infection that spreads through sexual contact. If it is not treated, it can cause serious problems. It must be treated with antibiotic medicine. Sometimes, you may not have symptoms (asymptomatic). When you have symptoms, they can include:  Burning when you pee (urinate).  Peeing often.  Fluid (discharge) coming from the vagina.  Redness, soreness, and swelling (inflammation) of the butt (rectum).  Bleeding or fluid coming from the butt.  Belly (abdominal) pain.  Pain during sex.  Bleeding between periods.  Itching, burning, or redness in the eyes.  Fluid coming from the eyes.  Follow these instructions at home: Medicines  Take over-the-counter and prescription medicines only as told by your doctor.  Take your antibiotic medicine as told by your doctor. Do not stop taking the antibiotic even if you start to feel better. Sexual activity  Tell sex partners about your infection. Sex partners are people you had oral, anal, or vaginal sex with within 60 days of when you started getting sick. They need treatment, too.  Do not have sex until: ? You and your sex partners have been treated. ? Your doctor says it is okay.  If you have a single dose treatment, wait 7 days before having sex. General instructions  It is up to you to get your test results. Ask your doctor when your results will be ready.  Get a lot of rest.  Eat healthy foods.  Drink enough fluid to keep your pee (urine) clear or pale yellow.  Keep all follow-up visits as told by your doctor. You may need tests after 3 months. Preventing chlamydia  The only way to prevent chlamydia is not to have sex. To lower your risk: ? Use latex condoms correctly. Do this every time you have sex. ? Avoid having many sex partners. ? Ask if your partner has been tested for STDs and if he or she had negative results. Contact a doctor if:  You  get new symptoms.  You do not get better with treatment.  You have a fever or chills.  You have pain during sex. Get help right away if:  Your pain gets worse and does not get better with medicine.  You get flu-like symptoms, such as: ? Night sweats. ? Sore throat. ? Muscle aches.  You feel sick to your stomach (nauseous).  You throw up (vomit).  You have trouble swallowing.  You have bleeding: ? Between periods. ? After sex.  You have irregular periods.  You have belly pain that does not get better with medicine.  You have lower back pain that does not get better with medicine.  You feel weak or dizzy.  You pass out (faint).  You are pregnant and you get symptoms of chlamydia. Summary  Chlamydia is an infection that spreads through sexual contact.  Sometimes, chlamydia can cause no symptoms (asymptomatic).  Do not have sex until your doctor says it is okay.  All sex partners will have to be treated for chlamydia. This information is not intended to replace advice given to you by your health care provider. Make sure you discuss any questions you have with your health care provider. Document Released: 02/19/2008 Document Revised: 05/01/2016 Document Reviewed: 05/01/2016 Elsevier Interactive Patient Education  2017 Elsevier Inc.  

## 2017-06-02 NOTE — Telephone Encounter (Signed)
Attempted to contact pt, no answer. A VM was left asking for a return call regarding her test results.

## 2017-06-02 NOTE — Progress Notes (Signed)
   Patient in nurse clinic today for STD treatment of chlamydia.  Patient advised to abstain from sex for 7-10 days after treatment or when partner has been tested/treated.  Azithromycin 1 GM PO x 1 given per Dr. McDiarmid's orders.  Advised to use condoms with all sexual activity. Patient verbalized understanding. Bag of condoms given. STD report form fax completed and [facility] at [phone] (STD department).   Fredderick SeveranceUCATTE, Kehlani Vancamp L, RN

## 2017-06-02 NOTE — Progress Notes (Signed)
Order placed for 1g azithro to treat chlamydia.  Lisa PattyAngela Maheen Cwikla, DO PGY-2, [facility] 06/02/2017 9:33 AM

## 2017-06-02 NOTE — Telephone Encounter (Signed)
Order is needed from PCP for chlamydia treatment. Please advise. Kinnie FeilL. Ducatte, RN, BSN

## 2017-06-02 NOTE — Telephone Encounter (Signed)
Pt returned call, informed her of positive chlamydia, pt is scheduled today at 10a for treatment, [facility] at [facility].

## 2017-06-26 ENCOUNTER — Ambulatory Visit: Payer: Medicaid Other | Admitting: Family Medicine

## 2017-06-26 ENCOUNTER — Other Ambulatory Visit: Payer: Self-pay

## 2017-06-26 ENCOUNTER — Other Ambulatory Visit (HOSPITAL_COMMUNITY)
Admission: RE | Admit: 2017-06-26 | Discharge: 2017-06-26 | Disposition: A | Discharge status: Home / Self Care | Payer: Medicaid Other | Source: Ambulatory Visit | Attending: Family Medicine | Admitting: Family Medicine

## 2017-06-26 ENCOUNTER — Encounter: Payer: Self-pay | Admitting: Family Medicine

## 2017-06-26 VITALS — BP 100/60 | HR 74 | Temp 98.1°F | Wt 139.0 lb

## 2017-06-26 DIAGNOSIS — A749 Chlamydial infection, unspecified: Secondary | ICD-10-CM | POA: Diagnosis not present

## 2017-06-26 DIAGNOSIS — N898 Other specified noninflammatory disorders of vagina: Secondary | ICD-10-CM

## 2017-06-26 LAB — POCT WET PREP (WET MOUNT)
Clue Cells Wet Prep Whiff POC: NEGATIVE
TRICHOMONAS WET PREP HPF POC: ABSENT

## 2017-06-26 MED ORDER — FLUCONAZOLE 150 MG PO TABS: 150.0000 mg | ORAL_TABLET | Freq: Once | ORAL | 0 refills | Status: AC

## 2017-06-26 NOTE — Assessment & Plan Note (Addendum)
Wet prep positive for yeast and bacteria, no BV. No pelvic pain. -awaiting results for Gonorrhea and chlamydia.  -Will treat yeast infection with diflucan- Attempted to call patient about sending diflucan to her pharmacy with instructions. -will call patient with results of G/C -Encouraged condom use

## 2017-06-26 NOTE — Patient Instructions (Signed)
Thank you for coming to see me today. It was a pleasure! Today we talked about:   Your vaginal discharge.   Please follow-up with your regular doctor as needed.  If you have any questions or concerns, please do not hesitate to call the office at [phone].  Take Care,   [place] Ayinde Swim, DO

## 2017-06-26 NOTE — Progress Notes (Signed)
   Subjective:    Patient ID: Lisa Woods, female    DOB: Oct 07, 1990, 27 y.o.   MRN: [id number]   CC: IUD string check and re-check for chlamydia  HPI:  Vaginal Discharge: Recent treatment for BV and chlamydia - has been ongoing since last visit, but reports the discharge is thinner since she took the metronidazole and azithromycin- reports her partner was also treated for chlamydia  - Denies itching burning, abdominal pain, nausea or vomiting - Discharge described as thin and white.  - Patient reports no STD in the past.  - Sexully active with multiple female partners, and reportedly uses condoms most times.  - Patient reports spotting occasionally with IUD.  - Denies burning with urination, no hematuria.  - Patient recently treated for BV.    Patient Active Problem List   Diagnosis Date Noted  . STD exposure 11/06/2016  . Bed bug bite 07/30/2016  . Rash and nonspecific skin eruption 12/30/2015  . Healthcare maintenance 12/30/2015  . Vaginal discharge 12/13/2012  . Epidermal cyst 03/12/2012  . Contraception management 03/12/2012     Objective:  BP 100/60   Pulse 74   Temp 98.1 F (36.7 C) (Oral)   Wt 139 lb (63 kg)   LMP 03/24/2017   SpO2 99%   BMI 23.13 kg/m  Vitals and nursing note reviewed  General: NAD, pleasant Respiratory: normal effort Abdomen: soft, nontender Extremities: WWP. GU/GYN: External genitalia within normal limits. Vaginal mucosa pink, moist, normal rugae. Nonfriable cervix without lesions, yellow-green, thick discharge and cottage-cheese appearing discharge noted on speculum exam, no bleeding, IUD strings in place.  Bimanual exam revealed normal, nongravid uterus.  No cervical motion tenderness. Exam performed in the presence of a chaperone. Skin: warm and dry, no rashes noted Neuro: alert and oriented, no focal deficits   Assessment & Plan:    Vaginal discharge Wet prep positive for yeast and bacteria, no BV. No pelvic pain. -awaiting  results for Gonorrhea and chlamydia.  -Will treat yeast infection with diflucan- Attempted to call patient about sending diflucan to her pharmacy with instructions. -will call patient with results of G/C -Encouraged condom use   [place] Rojean Ige, DO Family Medicine Resident PGY-1

## 2017-06-29 LAB — CERVICOVAGINAL ANCILLARY ONLY
CHLAMYDIA, DNA PROBE: NEGATIVE
Neisseria Gonorrhea: NEGATIVE

## 2017-07-28 ENCOUNTER — Ambulatory Visit: Payer: Medicaid Other | Admitting: Family Medicine

## 2017-07-30 ENCOUNTER — Encounter: Payer: Self-pay | Admitting: Family Medicine

## 2017-07-30 ENCOUNTER — Other Ambulatory Visit: Payer: Self-pay

## 2017-07-30 ENCOUNTER — Ambulatory Visit: Payer: Medicaid Other | Admitting: Family Medicine

## 2017-07-30 VITALS — BP 100/60 | HR 77 | Temp 98.1°F | Wt 143.0 lb

## 2017-07-30 DIAGNOSIS — Z319 Encounter for procreative management, unspecified: Secondary | ICD-10-CM

## 2017-07-30 DIAGNOSIS — Z30432 Encounter for removal of intrauterine contraceptive device: Secondary | ICD-10-CM

## 2017-07-30 MED ORDER — PRENATAL VITAMIN 27-0.8 MG PO TABS: 1.0000 | ORAL_TABLET | Freq: Every day | ORAL | 11 refills | Status: DC

## 2017-07-30 NOTE — Patient Instructions (Signed)
   It was great seeing you today!  Please let us know how things are going with getting pregnant and we are happy to see you for your prenatal care.  If you have questions or concerns please do not hesitate to call at [phone].  Dolores PattyAngela Heidie Krall, DO PGY-2, [facility] 07/30/2017 10:52 AM

## 2017-07-30 NOTE — Progress Notes (Signed)
    Subjective:    Patient ID: Lisa Woods, female    DOB: January 23, 1991, 27 y.o.   MRN: [id number]   CC: wants IUD removed  Patient desires pregnancy again. She has no issues with the IUD, she occasionally has some spotting. She would consider IUD again in future. She has one 816 year old daughter at home and feels ready to have another child. Not currently taking prenatal vitamins. Denies smoking, alcohol, or drug use.  Smoking status reviewed- non smoker  Review of Systems- See HPI   Objective:  BP 100/60   Pulse 77   Temp 98.1 F (36.7 C) (Oral)   Wt 143 lb (64.9 kg)   SpO2 98%   BMI 23.80 kg/m  Vitals and nursing note reviewed  General: well nourished, in no acute distress Abdomen: soft, nontender, nondistended, no masses or organomegaly. Bowel sounds present GU: normal female external genitalia, cervix pink without lesions, IUD strings visible. Small amount of vaginal mucous present.  Skin: warm and dry, no rashes noted Neuro: alert and oriented, no focal deficits   Assessment & Plan:    1. Encounter for IUD removal IUD removed by gently tugging on strings, patient tolerated removal well. No immediate complications noted.   2. Patient desires pregnancy Prenatal vitamins sent to patients pharmacy   Return as needed.   Dolores PattyAngela Angelino Rumery, DO Family Medicine Resident PGY-2

## 2017-12-30 ENCOUNTER — Other Ambulatory Visit (HOSPITAL_COMMUNITY)
Admission: RE | Admit: 2017-12-30 | Discharge: 2017-12-30 | Disposition: A | Discharge status: Home / Self Care | Payer: Medicaid Other | Source: Ambulatory Visit | Attending: Family Medicine | Admitting: Family Medicine

## 2017-12-30 ENCOUNTER — Other Ambulatory Visit: Payer: Self-pay

## 2017-12-30 ENCOUNTER — Ambulatory Visit: Payer: Medicaid Other | Admitting: Family Medicine

## 2017-12-30 VITALS — BP 98/62 | HR 78 | Temp 98.6°F | Ht 65.0 in | Wt 148.8 lb

## 2017-12-30 DIAGNOSIS — F17200 Nicotine dependence, unspecified, uncomplicated: Secondary | ICD-10-CM | POA: Diagnosis not present

## 2017-12-30 DIAGNOSIS — Z3490 Encounter for supervision of normal pregnancy, unspecified, unspecified trimester: Secondary | ICD-10-CM

## 2017-12-30 DIAGNOSIS — R102 Pelvic and perineal pain: Secondary | ICD-10-CM | POA: Diagnosis not present

## 2017-12-30 DIAGNOSIS — Z3200 Encounter for pregnancy test, result unknown: Secondary | ICD-10-CM

## 2017-12-30 LAB — POCT URINE PREGNANCY: PREG TEST UR: NEGATIVE

## 2017-12-30 MED ORDER — PRENATAL ADULT GUMMY/DHA/FA 0.4-25 MG PO CHEW: 1.0000 | CHEWABLE_TABLET | Freq: Every day | ORAL | 2 refills | Status: DC

## 2017-12-30 MED ORDER — NICOTINE POLACRILEX 4 MG MT GUM: 4.0000 mg | CHEWING_GUM | OROMUCOSAL | 0 refills | Status: DC | PRN

## 2017-12-30 NOTE — Assessment & Plan Note (Addendum)
Patient trying to get pregnant, will start prenatal vit  Upreg ordered

## 2017-12-30 NOTE — Patient Instructions (Signed)
It was a pleasure to see you today! Thank you for choosing [facility] for your primary care. Lisa Woods was seen for pregnancy test and STI screening. Come back to the clinic if you have any routine concerns, and go to the emergency room if you have any life threatening symptoms.  Today we did some urine testing for infection and for pregnancy.  We also prescribed some prenatal vitamins and some nicotine gum. If you are pregnant we'll need to schedule you for a prenatal care visit.  If we did any lab work today that did not result today, one of two things will happen.  1. If everything is normal, you will get a letter in mail sent to the address in your chart with the results for your records.  It is important to keep your address up to date as that is where we will send results.  2. If the results require some sort of discussion, my nurses or myself will call you on the phone number listed in your records.  It is important to keep your phone number up to date in our system as this is how we will try to reach you.  If we cannot reach you on the phone, we will try to send you a letter in the mail so please enable to voicemail function of your phone.  If you don't hear from us in two weeks, please give us a call to verify your results. Otherwise, we look forward to seeing you again at your next visit. If you have any questions or concerns before then, please call the clinic at [phone].   Please bring all your medications to every doctors visit   Sign up for My Chart to have easy access to your labs results, and communication with your Primary care physician.     Please check-out at the front desk before leaving the clinic.     Best,  Dr. Marthenia RollingScott Karessa Onorato FAMILY MEDICINE RESIDENT - PGY2 12/30/2017 3:43 PM

## 2017-12-30 NOTE — Progress Notes (Signed)
    Subjective:  Lisa Woods is a 27 y.o. female who [facility] today with a chief complaint of wanting pregnancy test.   HPI: Patient has been trying to get pregnant and would like to get a pregnancy test. She would also like to start a prenatal and stop smoking.  She has had 1 sexual partner lately and is ~6 weeks since her last menses.  No pain with urination but does have an occasional sharp twinge in her pelvic area along midline.  No rash/discharge/bleeding/fever symptoms.   Objective:  Physical Exam: BP 98/62   Pulse 78   Temp 98.6 F (37 C) (Oral)   Ht 5\' 5"  (1.651 m)   Wt 148 lb 12.8 oz (67.5 kg)   LMP 11/22/2017 (Exact Date)   SpO2 97%   BMI 24.76 kg/m   Gen: NAD, resting comfortably CV: RRR with no murmurs appreciated Pulm: NWOB, CTAB with no crackles, wheezes, or rhonchi GI: Normal bowel sounds present. Soft, Nontender to palpation, Nondistended. MSK: no edema, cyanosis, or clubbing noted Skin: warm, dry Neuro: grossly normal, moves all extremities Psych: Normal affect and thought content  Results for orders placed or performed in visit on 12/30/17 (from the past 72 hour(s))  POCT urine pregnancy     Status: None   Collection Time: 12/30/17  3:40 PM  Result Value Ref Range   Preg Test, Ur Negative Negative     Assessment/Plan:  Prenatal care, antepartum Patient trying to get pregnant, will start prenatal vit  Upreg ordered  Tobacco use disorder Trying to start pregnancy, will try occasional nicotine gum to stop final ciggarette per day  Pelvic pain Intermittent a few times per day.  4-5/10 pain.  "stabby" and then resolved spontaneously.  No discharge  UA with G/C/trich, patient has already peed so we sent her home with specimen cup and she will bring back   Marthenia RollingScott Ayodeji Keimig, DO FAMILY MEDICINE RESIDENT - PGY2 12/31/2017 2:06 PM

## 2017-12-30 NOTE — Assessment & Plan Note (Signed)
Trying to start pregnancy, will try occasional nicotine gum to stop final ciggarette per day

## 2017-12-30 NOTE — Assessment & Plan Note (Addendum)
Intermittent a few times per day.  4-5/10 pain.  "stabby" and then resolved spontaneously.  No discharge  UA with G/C/trich, patient has already peed so we sent her home with specimen cup and she will bring back

## 2017-12-31 LAB — URINE CYTOLOGY ANCILLARY ONLY
CHLAMYDIA, DNA PROBE: NEGATIVE
NEISSERIA GONORRHEA: NEGATIVE
Trichomonas: NEGATIVE

## 2018-01-22 ENCOUNTER — Ambulatory Visit (INDEPENDENT_AMBULATORY_CARE_PROVIDER_SITE_OTHER): Payer: Medicaid Other

## 2018-01-22 DIAGNOSIS — Z23 Encounter for immunization: Secondary | ICD-10-CM

## 2018-01-22 NOTE — Progress Notes (Signed)
   Patient given flu shot under immunization clinic. Encounter opened in error.  Ples SpecterAlisa Brake, RN [facility] RN)

## 2018-04-02 ENCOUNTER — Other Ambulatory Visit: Payer: Self-pay

## 2018-04-02 ENCOUNTER — Other Ambulatory Visit (HOSPITAL_COMMUNITY)
Admission: RE | Admit: 2018-04-02 | Discharge: 2018-04-02 | Disposition: A | Discharge status: Home / Self Care | Payer: Medicaid Other | Source: Ambulatory Visit | Attending: Family Medicine | Admitting: Family Medicine

## 2018-04-02 ENCOUNTER — Ambulatory Visit (INDEPENDENT_AMBULATORY_CARE_PROVIDER_SITE_OTHER): Payer: Medicaid Other | Admitting: Student in an Organized Health Care Education/Training Program

## 2018-04-02 VITALS — BP 104/70 | HR 79 | Temp 98.8°F | Ht 65.0 in | Wt 151.0 lb

## 2018-04-02 DIAGNOSIS — N898 Other specified noninflammatory disorders of vagina: Secondary | ICD-10-CM | POA: Insufficient documentation

## 2018-04-02 DIAGNOSIS — B9689 Other specified bacterial agents as the cause of diseases classified elsewhere: Secondary | ICD-10-CM

## 2018-04-02 DIAGNOSIS — N760 Acute vaginitis: Secondary | ICD-10-CM | POA: Diagnosis not present

## 2018-04-02 LAB — POCT WET PREP (WET MOUNT)
CLUE CELLS WET PREP WHIFF POC: POSITIVE
Trichomonas Wet Prep HPF POC: ABSENT

## 2018-04-02 MED ORDER — METRONIDAZOLE 500 MG PO TABS: 500.0000 mg | ORAL_TABLET | Freq: Two times a day (BID) | ORAL | 0 refills | Status: DC

## 2018-04-02 NOTE — Patient Instructions (Signed)
It was a pleasure seeing you today in our clinic.  We drew blood work at today's visit. I will call or send you a letter with these results. If you do not hear from me within the next week, please give our office a call.  Our clinic's number is [phone]. Please call with questions or concerns about what we discussed today.  Be well, Dr. Jaziah Kwasnik   

## 2018-04-02 NOTE — Progress Notes (Signed)
   CC: STD testing and vaginal discharge  HPI: Lisa Woods is a 27 y.o. female   Patient reports she has had a couple of weeks of thick vaginal discharge consistent with previous BV infections. No pain or pruritis. She denies dysuria, hematuria, urinary frequency or urgency. She reports using condoms every time with intercourse. She does want STI testing today. She is sexually active with one female partner. She has not had any painful intercourse. She denies noting any vaginal lesions.  No abdominal pain. No N/V/D. No fevers.  Review of Symptoms:  See HPI for ROS.   CC, SH/smoking status, and VS noted.  Objective: BP 104/70   Pulse 79   Temp 98.8 F (37.1 C) (Oral)   Ht 5\' 5"  (1.651 m)   Wt 151 lb (68.5 kg)   LMP 02/23/2018   SpO2 94%   BMI 25.13 kg/m  GEN: NAD, alert, cooperative, and pleasant. RESPIRATORY: Comfortable work of breathing, speaks in full sentences CV: Regular rate noted, distal extremities well perfused and warm without edema GI: Soft, nondistended, nontender SKIN: warm and dry, no rashes or lesions NEURO: II-XII grossly intact MSK: Moves 4 extremities equally PSYCH: AAOx3, appropriate affect GU: Normal appearing vulva and cervix with watery white discharge. No external lesions. No internal lesions.   Assessment and plan:  1. Request for STI testing - RPR - HIV Antibody (routine testing w rflx) - POCT Wet Prep [facility]) - Cervicovaginal ancillary only - reviewed safe sex precautions  2. Bacterial vaginosis - noted on wet prep - metroNIDAZOLE (FLAGYL) 500 MG tablet; Take 1 tablet (500 mg total) by mouth 2 (two) times daily.  Dispense: 14 tablet; Refill: 0  Orders Placed This Encounter  Procedures  . RPR  . HIV Antibody (routine testing w rflx)    No orders of the defined types were placed in this encounter.    Howard Pouch, MD,MS,  PGY3 04/02/2018 4:29 PM

## 2018-04-03 LAB — HIV ANTIBODY (ROUTINE TESTING W REFLEX): HIV Screen 4th Generation wRfx: NONREACTIVE

## 2018-04-03 LAB — RPR: RPR Ser Ql: NONREACTIVE

## 2018-04-04 ENCOUNTER — Encounter: Payer: Self-pay | Admitting: Student in an Organized Health Care Education/Training Program

## 2018-04-06 LAB — CERVICOVAGINAL ANCILLARY ONLY
CHLAMYDIA, DNA PROBE: NEGATIVE
NEISSERIA GONORRHEA: NEGATIVE

## 2018-04-07 ENCOUNTER — Encounter: Payer: Self-pay | Admitting: Student in an Organized Health Care Education/Training Program

## 2018-04-21 ENCOUNTER — Encounter: Payer: Medicaid Other | Admitting: Family Medicine

## 2018-11-17 ENCOUNTER — Encounter: Payer: Medicaid Other | Admitting: Family Medicine

## 2019-01-26 ENCOUNTER — Ambulatory Visit (INDEPENDENT_AMBULATORY_CARE_PROVIDER_SITE_OTHER): Payer: Self-pay | Admitting: Family Medicine

## 2019-01-26 ENCOUNTER — Other Ambulatory Visit: Payer: Self-pay

## 2019-01-26 VITALS — BP 130/70 | Ht 65.0 in | Wt 150.0 lb

## 2019-01-26 DIAGNOSIS — L732 Hidradenitis suppurativa: Secondary | ICD-10-CM | POA: Insufficient documentation

## 2019-01-26 LAB — POCT URINE PREGNANCY: Preg Test, Ur: NEGATIVE

## 2019-01-26 MED ORDER — CLINDAMYCIN HCL 300 MG PO CAPS: 300.0000 mg | ORAL_CAPSULE | Freq: Two times a day (BID) | ORAL | 0 refills | Status: DC

## 2019-01-26 MED ORDER — SILVER SULFADIAZINE 1 % EX CREA: 1.0000 "application " | TOPICAL_CREAM | Freq: Every day | CUTANEOUS | 0 refills | Status: DC

## 2019-01-26 NOTE — Patient Instructions (Addendum)
Dear Lisa Woods,   It was good to see you! Thank you for taking your time to come in to be seen. Today, we discussed the following:   Hidradenitis Suppurativa    Please read more about this chronic disease and the information listed below  I want you to take clindamycin 300 mg twice daily for 12 weeks, this is already been sent to your pharmacy.  Clindamycin is an antibiotic that will help with the bacteria in the area.  Please call us if you experience any new rashes, abdominal pain, nausea vomiting, diffuse watery diarrhea, headaches.  Please stay out of the sun while you take this medication.  Please also use the topical antibiotic cream once daily   Please keep the area clean and dry and covered.  Avoid shaving in the area.  See below for further tips.  Please follow up for concerning or worsening symptoms.   Be well,   Genia Hotter, M.D   [facility] [phone]  *Sign up for MyChart for instant access to your health profile, labs, orders, upcoming appointments or to contact your provider with questions*  ===================================================================================  Hidradenitis Suppurativa Hidradenitis suppurativa is a long-term (chronic) skin disease. It is similar to a severe form of acne, but it affects areas of the body where acne would be unusual, especially areas of the body where skin rubs against skin and becomes moist. These include:  Underarms.  Groin.  Genital area.  Buttocks.  Upper thighs.  Breasts. Hidradenitis suppurativa may start out as small lumps or pimples caused by blocked sweat glands or hair follicles. Pimples may develop into deep sores that break open (rupture) and drain pus. Over time, affected areas of skin may thicken and become scarred. This condition is rare and does not spread from person to person (non-contagious). What are the causes? The exact cause of this condition is not known. It may be  related to:  Female and female hormones.  An overactive disease-fighting system (immune system). The immune system may over-react to blocked hair follicles or sweat glands and cause swelling and pus-filled sores. What increases the risk? You are more likely to develop this condition if you:  Are female.  Are 83-43 years old.  Have a family history of hidradenitis suppurativa.  Have a personal history of acne.  Are overweight.  Smoke.  Take the medicine lithium. What are the signs or symptoms? The first symptoms are usually painful bumps in the skin, similar to pimples. The condition may get worse over time (progress), or it may only cause mild symptoms. If the disease progresses, symptoms may include:  Skin bumps getting bigger and growing deeper into the skin.  Bumps rupturing and draining pus.  Itchy, infected skin.  Skin getting thicker and scarred.  Tunnels under the skin (fistulas) where pus drains from a bump.  Pain during daily activities, such as pain during walking if your groin area is affected.  Emotional problems, such as stress or depression. This condition may affect your appearance and your ability or willingness to wear certain clothes or do certain activities. How is this diagnosed? This condition is diagnosed by a health care provider who specializes in skin diseases (dermatologist). You may be diagnosed based on:  Your symptoms and medical history.  A physical exam.  Testing a pus sample for infection.  Blood tests. How is this treated? Your treatment will depend on how severe your symptoms are. The same treatment will not work for everybody with this  condition. You may need to try several treatments to find what works best for you. Treatment may include:  Cleaning and bandaging (dressing) your wounds as needed.  Lifestyle changes, such as new skin care routines.  Taking medicines, such as: ? Antibiotics. ? Acne medicines. ? Medicines to  reduce the activity of the immune system. ? A diabetes medicine (metformin). ? Birth control pills, for women. ? Steroids to reduce swelling and pain.  Working with a mental health care provider, if you experience emotional distress due to this condition. If you have severe symptoms that do not get better with medicine, you may need surgery. Surgery may involve:  Using a laser to clear the skin and remove hair follicles.  Opening and draining deep sores.  Removing the areas of skin that are diseased and scarred. Follow these instructions at home: Medicines   Take over-the-counter and prescription medicines only as told by your health care provider.  If you were prescribed an antibiotic medicine, take it as told by your health care provider. Do not stop taking the antibiotic even if your condition improves. Skin care  If you have open wounds, cover them with a clean dressing as told by your health care provider. Keep wounds clean by washing them gently with soap and water when you bathe.  Do not shave the areas where you get hidradenitis suppurativa.  Do not wear deodorant.  Wear loose-fitting clothes.  Try to avoid getting overheated or sweaty. If you get sweaty or wet, change into clean, dry clothes as soon as you can.  To help relieve pain and itchiness, cover sore areas with a warm, clean washcloth (warm compress) for 5-10 minutes as often as needed.  If told by your health care provider, take a bleach bath twice a week: ? Fill your bathtub halfway with water. ? Pour in  cup of unscented household bleach. ? Soak in the tub for 5-10 minutes. ? Only soak from the neck down. Avoid water on your face and hair. ? Shower to rinse off the bleach from your skin. General instructions  Learn as much as you can about your disease so that you have an active role in your treatment. Work closely with your health care provider to find treatments that work for you.  If you are  overweight, work with your health care provider to lose weight as recommended.  Do not use any products that contain nicotine or tobacco, such as cigarettes and e-cigarettes. If you need help quitting, ask your health care provider.  If you struggle with living with this condition, talk with your health care provider or work with a mental health care provider as recommended.  Keep all follow-up visits as told by your health care provider. This is important. Where to find more information  Hidradenitis Cameron.: [email or web address]://www.hs-foundation.org/ Contact a health care provider if you have:  A flare-up of hidradenitis suppurativa.  A fever or chills.  Trouble controlling your symptoms at home.  Trouble doing your daily activities because of your symptoms.  Trouble dealing with emotional problems related to your condition. Summary  Hidradenitis suppurativa is a long-term (chronic) skin disease. It is similar to a severe form of acne, but it affects areas of the body where acne would be unusual.  The first symptoms are usually painful bumps in the skin, similar to pimples. The condition may get worse over time (progress), or it may only cause mild symptoms.  If you have open wounds, cover them with  a clean dressing as told by your health care provider. Keep wounds clean by washing them gently with soap and water when you bathe.  Besides skin care, treatment may include medicines, laser treatment, and surgery. This information is not intended to replace advice given to you by your health care provider. Make sure you discuss any questions you have with your health care provider. Document Released: 12/25/2003 Document Revised: 05/20/2017 Document Reviewed: 05/20/2017 Elsevier Patient Education  2020 [employer]

## 2019-01-26 NOTE — Progress Notes (Signed)
Established Patient - Acute Visit Patient ID: MRN [id number], 1990/11/19  PCP: Gladys Damme, MD  Subjective  CC: Abrasion  Lisa Woods is a 28 y.o. female with past medical history s/f previous abscesses  who presents today with the following problems:  Abscess on groin Patient presents today with a painful abscess on her right groin.  She reports that it was draining yesterday.  She reports that she gets these often, possibly once every 1 to 2 months. She reports that the last time she had a very bad one like this was last year on the opposite side  She reports that she has never had treatment for this previously.  She works from home and is sitting typically throughout the day and it hurts when sitting.  She has to sit with her right hip adjusted as did not put pressure on the painful area.  She denies any fevers, chills.  She reports doing 2 bleach baths in the last couple of days.  She also complains of some pain with palpation to her lymph node area.  She does not know what hidradenitis suppurativa is and has never been diagnosed in the past. She reports that she does get smaller bumps similar to the ones on her groin in the armpits. She does not know anything that makes the abscess better or how to prevent them. It seems that she gets the worst ones in the summer time.    ROS: Pertinent ROS included in HPI.  History: Medications, allergies, medical history, family history and social history were reviewed and edited as necessary.  Social Hx: Lisa Woods reports that she has been smoking cigars. She has been smoking about 0.25 packs per day. She has never used smokeless tobacco. She reports current alcohol use. She reports that she does not use drugs.   Objective   Physical Exam:  BP 130/70   Ht 5\' 5"  (1.651 m)   Wt 150 lb (68 kg)   LMP 01/03/2019   BMI 24.96 kg/m  Filed Weights   01/26/19 1134  Weight: 150 lb (68 kg)   General: NAD, non-toxic, well-appearing, sitting with right  hip off of the table. She has an ataxic gait (assumed to be from pain)   Integumentary:  2 cm nodule open at its apex draining clear fluid tinged with blood. Area is extremely tender to palpation and sensitive to light touch. I am able to express some serosanguinous drainage. There is a 3 cm x 0.75 cm keloids formed just dorsal to the nodule. With a couple other smaller keloids scattered in the same area. No major scarring appreciated on the left groin.   Pertinent Labs & Imaging:  Reviewed in chart     Assessment  Hidradenitis Patient presents with recurring abscesses in intertriginous groin area.  She reports previously having this on the left side.  She reports episodes where she experiences major pain and discomfort are about once a year.  In the meantime, she does get smaller abscesses in her armpits and groin.  She does have some evidence of scarring and keloid formation from previous abscesses.  Patient has never been on any antibiotics before for these issues.  Patient previously had an abscess on chest wall several years ago. Was originally going to place on doxycycline (UPT negative though is agreeable for contraceptive pills) given patient's childbearing age, will place patient on clindamycin twice daily and give her topical silver sulfadiazine.   -Provided patient with information about hidradenitis and ways to prevent abscesses -  Doxycycline x3 months -Obtain baseline labs, BMP, LFT, CBC  Orders Placed This Encounter  Procedures  . Comprehensive metabolic panel  . CBC with Differential  . POCT urine pregnancy      Melene Planachel E. Xiomar Crompton, M.D.  PGY-2  Family Medicine  [phone] 01/28/2019 8:41 AM

## 2019-01-28 ENCOUNTER — Other Ambulatory Visit: Payer: Self-pay

## 2019-01-28 ENCOUNTER — Other Ambulatory Visit: Payer: Self-pay | Admitting: Family Medicine

## 2019-01-28 DIAGNOSIS — L732 Hidradenitis suppurativa: Secondary | ICD-10-CM

## 2019-01-28 NOTE — Progress Notes (Signed)
Patient left previously without getting baseline labs.  Called patient and left voicemail to call the office to make a lab appointment.  Future lab orders have been ordered.

## 2019-01-28 NOTE — Assessment & Plan Note (Addendum)
Patient presents with recurring abscesses in intertriginous groin area.  She reports previously having this on the left side.  She reports episodes where she experiences major pain and discomfort are about once a year.  In the meantime, she does get smaller abscesses in her armpits and groin.  She does have some evidence of scarring and keloid formation from previous abscesses.  Patient has never been on any antibiotics before for these issues.  Patient previously had an abscess on chest wall several years ago. Was originally going to place on doxycycline (UPT negative though is agreeable for contraceptive pills) given patient's childbearing age, will place patient on clindamycin twice daily and give her topical silver sulfadiazine.   -Provided patient with information about hidradenitis and ways to prevent abscesses -Doxycycline x3 months -Obtain baseline labs, BMP, LFT, CBC - follow up if no improvement in 2-3 weeks

## 2019-01-29 LAB — COMPREHENSIVE METABOLIC PANEL
ALT: 9 IU/L (ref 0–32)
AST: 13 IU/L (ref 0–40)
Albumin/Globulin Ratio: 1.8 (ref 1.2–2.2)
Albumin: 4.5 g/dL (ref 3.9–5.0)
Alkaline Phosphatase: 73 IU/L (ref 39–117)
BUN/Creatinine Ratio: 11 (ref 9–23)
BUN: 10 mg/dL (ref 6–20)
Bilirubin Total: 0.4 mg/dL (ref 0.0–1.2)
CO2: 23 mmol/L (ref 20–29)
Calcium: 9.8 mg/dL (ref 8.7–10.2)
Chloride: 103 mmol/L (ref 96–106)
Creatinine, Ser: 0.92 mg/dL (ref 0.57–1.00)
GFR calc Af Amer: 99 mL/min/{1.73_m2} (ref >59)
GFR calc non Af Amer: 86 mL/min/{1.73_m2} (ref >59)
Globulin, Total: 2.5 g/dL (ref 1.5–4.5)
Glucose: 77 mg/dL (ref 65–99)
Potassium: 3.9 mmol/L (ref 3.5–5.2)
Sodium: 140 mmol/L (ref 134–144)
Total Protein: 7 g/dL (ref 6.0–8.5)

## 2019-01-29 LAB — CBC
Hematocrit: 39.1 % (ref 34.0–46.6)
Hemoglobin: 12.8 g/dL (ref 11.1–15.9)
MCH: 29.7 pg (ref 26.6–33.0)
MCHC: 32.7 g/dL (ref 31.5–35.7)
MCV: 91 fL (ref 79–97)
Platelets: 144 10*3/uL — ABNORMAL LOW (ref 150–450)
RBC: 4.31 x10E6/uL (ref 3.77–5.28)
RDW: 11.1 % — ABNORMAL LOW (ref 11.7–15.4)
WBC: 7.1 10*3/uL (ref 3.4–10.8)

## 2019-02-04 ENCOUNTER — Encounter: Payer: Self-pay | Admitting: Family Medicine

## 2019-02-04 ENCOUNTER — Telehealth: Payer: Self-pay

## 2019-02-04 DIAGNOSIS — D696 Thrombocytopenia, unspecified: Secondary | ICD-10-CM | POA: Insufficient documentation

## 2019-02-04 NOTE — Telephone Encounter (Signed)
Pt was in the office on 9/4. Was given some medicine. Dr told pt that if she noticed a change with the medication to give her a call. Pt has noticed a change and would like to discuss this with the Dr. Who saw her. Ottis Stain, CMA

## 2019-02-04 NOTE — Telephone Encounter (Signed)
Patient needs to know if she should stop taking this medication that she called about earlier, she would like to be called back before we close today, at [phone].

## 2019-02-07 NOTE — Telephone Encounter (Signed)
Spoke to patient over the phone.  She reports that she had a bloody bowel movement when she calls last week.  She noticed bright red blood on the toilet paper and has had hemorrhoids in the past. She has not had any more bleeding since.  Patient stopped taking medication because she was worried that the medication was causing this.  I reassured her that the bleeding is likely due to hemorrhoids.  I encouraged patient to keep taking the medication for the hidradenitis and if she continues to have blood with wiping, she should make an appointment to be seen.    Wilber Oliphant, M.D.  PGY-2  Family Medicine  [phone] 02/07/2019 4:23 PM

## 2019-04-24 ENCOUNTER — Other Ambulatory Visit: Payer: Self-pay

## 2019-04-24 ENCOUNTER — Encounter (HOSPITAL_COMMUNITY): Payer: Self-pay

## 2019-04-24 ENCOUNTER — Ambulatory Visit (HOSPITAL_COMMUNITY)
Admission: EM | Admit: 2019-04-24 | Discharge: 2019-04-24 | Disposition: A | Discharge status: Home / Self Care | Payer: Self-pay | Attending: Family Medicine | Admitting: Family Medicine

## 2019-04-24 DIAGNOSIS — Z3202 Encounter for pregnancy test, result negative: Secondary | ICD-10-CM

## 2019-04-24 DIAGNOSIS — N390 Urinary tract infection, site not specified: Secondary | ICD-10-CM | POA: Insufficient documentation

## 2019-04-24 LAB — POCT URINALYSIS DIP (DEVICE)
Bilirubin Urine: NEGATIVE
Glucose, UA: NEGATIVE mg/dL
Ketones, ur: NEGATIVE mg/dL
Nitrite: NEGATIVE
Protein, ur: 100 mg/dL — AB
Specific Gravity, Urine: 1.025 (ref 1.005–1.030)
Urobilinogen, UA: 0.2 mg/dL (ref 0.0–1.0)
pH: 6.5 (ref 5.0–8.0)

## 2019-04-24 LAB — POC URINE PREG, ED
Preg Test, Ur: NEGATIVE
Preg Test, Ur: NEGATIVE

## 2019-04-24 LAB — POCT PREGNANCY, URINE: Preg Test, Ur: NEGATIVE

## 2019-04-24 MED ORDER — CIPROFLOXACIN HCL 250 MG PO TABS: 250.0000 mg | ORAL_TABLET | Freq: Two times a day (BID) | ORAL | 0 refills | Status: DC

## 2019-04-24 MED ORDER — PHENAZOPYRIDINE HCL 200 MG PO TABS: 200.0000 mg | ORAL_TABLET | Freq: Three times a day (TID) | ORAL | 0 refills | Status: DC | PRN

## 2019-04-24 NOTE — ED Triage Notes (Signed)
Pt presents with right side flank pain, nausea, urinary urgency, and discomfort when urinating for over a week.

## 2019-04-24 NOTE — ED Provider Notes (Signed)
[facility]    CSN: [id number] Arrival date & time: 04/24/19  1713      History   Chief Complaint Chief Complaint  Patient presents with  . Urinary Tract Infection  . Flank Pain    HPI Lisa Woods is a 28 y.o. female.   This is a 28 year old established most: Urgent care patient with urinary tract infection symptoms.   Pt presents with right side flank pain, nausea, urinary urgency, and discomfort when urinating for over a week.      Past Medical History:  Diagnosis Date  . Chlamydia     Patient Active Problem List   Diagnosis Date Noted  . Thrombocytopenia ([place]) 02/04/2019  . Hidradenitis 01/26/2019  . Tobacco use disorder 12/30/2017  . Contraception management 03/12/2012    History reviewed. No pertinent surgical history.  OB History    Gravida  1   Para  1   Term  1   Preterm      AB      Living  1     SAB      TAB      Ectopic      Multiple      Live Births  1            Home Medications    Prior to Admission medications   Medication Sig Start Date End Date Taking? Authorizing Provider  ciprofloxacin (CIPRO) 250 MG tablet Take 1 tablet (250 mg total) by mouth every 12 (twelve) hours. 04/24/19   Robyn Haber, MD  nicotine polacrilex (NICORETTE) 4 MG gum Take 1 each (4 mg total) by mouth as needed for smoking cessation. 12/30/17   Sherene Sires, DO  phenazopyridine (PYRIDIUM) 200 MG tablet Take 1 tablet (200 mg total) by mouth 3 (three) times daily as needed for pain. 04/24/19   Robyn Haber, MD  Prenatal MV & Min w/FA-DHA (PRENATAL ADULT GUMMY/DHA/FA) 0.4-25 MG CHEW Chew 1 tablet by mouth daily. 12/30/17   Sherene Sires, DO    Family History Family History  Problem Relation Age of Onset  . Cancer Mother        Breast Cancer    Social History Social History   Tobacco Use  . Smoking status: Current Some Day Smoker    Packs/day: 0.25    Types: Cigars  . Smokeless tobacco: Never Used  . Tobacco comment:  only smokes 1 cig a day. Black and mild.  Substance Use Topics  . Alcohol use: Yes    Comment: occasional   . Drug use: No     Allergies   Patient has no known allergies.   Review of Systems Review of Systems  Constitutional: Positive for chills. Negative for fever.  HENT: Negative.   Gastrointestinal: Positive for abdominal pain and nausea. Negative for vomiting.  Genitourinary: Positive for dysuria, flank pain and urgency.  All other systems reviewed and are negative.    Physical Exam Triage Vital Signs ED Triage Vitals  Enc Vitals Group     BP 04/24/19 1740 (!) 141/90     Pulse Rate 04/24/19 1740 66     Resp 04/24/19 1740 18     Temp 04/24/19 1740 99.2 F (37.3 C)     Temp src --      SpO2 04/24/19 1740 99 %     Weight --      Height --      Head Circumference --      Peak Flow --  Pain Score 04/24/19 1738 6     Pain Loc --      Pain Edu? --      Excl. in GC? --    No data found.  Updated Vital Signs BP (!) 141/90 (BP Location: Left Arm)   Pulse 66   Temp 99.2 F (37.3 C)   Resp 18   LMP 03/28/2019   SpO2 99%    Physical Exam Vitals signs and nursing note reviewed.  Constitutional:      Appearance: Normal appearance. She is normal weight. She is not ill-appearing or toxic-appearing.  Eyes:     Conjunctiva/sclera: Conjunctivae normal.  Neck:     Musculoskeletal: Normal range of motion and neck supple.  Cardiovascular:     Rate and Rhythm: Normal rate and regular rhythm.  Pulmonary:     Effort: Pulmonary effort is normal.  Abdominal:     Palpations: Abdomen is soft.     Tenderness: There is abdominal tenderness. There is no guarding or rebound.     Comments: Tender left abdomen and suprapubic area.  Musculoskeletal: Normal range of motion.  Skin:    General: Skin is warm and dry.  Neurological:     General: No focal deficit present.     Mental Status: She is alert and oriented to person, place, and time.  Psychiatric:        Mood and  Affect: Mood normal.      UC Treatments / Results  Labs (all labs ordered are listed, but only abnormal results are displayed) Labs Reviewed  POCT URINALYSIS DIP (DEVICE) - Abnormal; Notable for the following components:      Result Value   Hgb urine dipstick LARGE (*)    Protein, ur 100 (*)    Leukocytes,Ua SMALL (*)    All other components within normal limits  URINE CULTURE  POC URINE PREG, ED    EKG   Radiology No results found.  Procedures Procedures (including critical care time)  Medications Ordered in UC Medications - No data to display  Initial Impression / Assessment and Plan / UC Course  I have reviewed the triage vital signs and the nursing notes.  Pertinent labs & imaging results that were available during my care of the patient were reviewed by me and considered in my medical decision making (see chart for details).    Final Clinical Impressions(s) / UC Diagnoses   Final diagnoses:  Lower urinary tract infectious disease     Discharge Instructions     You are running a urine culture and will call you if we need to change the antibiotic.  Usually with Cipro, the symptoms improved very rapidly (overnight).  It is important that you take all the Cipro.    ED Prescriptions    Medication Sig Dispense Auth. Provider   ciprofloxacin (CIPRO) 250 MG tablet Take 1 tablet (250 mg total) by mouth every 12 (twelve) hours. 14 tablet Elvina Sidle, MD   phenazopyridine (PYRIDIUM) 200 MG tablet Take 1 tablet (200 mg total) by mouth 3 (three) times daily as needed for pain. 10 tablet Elvina Sidle, MD     I have reviewed the PDMP during this encounter.   Elvina Sidle, MD 04/24/19 1755

## 2019-04-24 NOTE — Discharge Instructions (Addendum)
You are running a urine culture and will call you if we need to change the antibiotic.  Usually with Cipro, the symptoms improved very rapidly (overnight).  It is important that you take all the Cipro.

## 2019-04-26 LAB — URINE CULTURE: Culture: >=100000 — AB

## 2019-05-30 ENCOUNTER — Ambulatory Visit: Payer: Self-pay

## 2019-07-12 ENCOUNTER — Other Ambulatory Visit: Payer: Self-pay

## 2019-07-12 ENCOUNTER — Ambulatory Visit (INDEPENDENT_AMBULATORY_CARE_PROVIDER_SITE_OTHER): Payer: No Typology Code available for payment source

## 2019-07-12 DIAGNOSIS — Z111 Encounter for screening for respiratory tuberculosis: Secondary | ICD-10-CM

## 2019-07-12 NOTE — Progress Notes (Signed)
Patient is here for a PPD placement.  It was placed on 02//16/2021 in the left forearm @ 11 am.   Site unremarkable  PPD read scheduled for Thursday afternoon.    Veronda Prude, RN

## 2019-07-14 ENCOUNTER — Ambulatory Visit: Payer: No Typology Code available for payment source

## 2019-07-15 ENCOUNTER — Ambulatory Visit (INDEPENDENT_AMBULATORY_CARE_PROVIDER_SITE_OTHER): Payer: No Typology Code available for payment source

## 2019-07-15 ENCOUNTER — Other Ambulatory Visit: Payer: Self-pay

## 2019-07-15 DIAGNOSIS — Z111 Encounter for screening for respiratory tuberculosis: Secondary | ICD-10-CM

## 2019-07-15 LAB — TB SKIN TEST
Induration: 0 mm
TB Skin Test: NEGATIVE

## 2019-07-15 NOTE — Progress Notes (Signed)
Patient is here for a PPD read.  It was placed on 07/12/2019 in the left forearm @ 11 am.     PPD RESULTS:  Result: negative Induration: 0 mm  Letter created and given to patient for documentation purposes. Veronda Prude, RN

## 2019-07-20 ENCOUNTER — Ambulatory Visit: Payer: No Typology Code available for payment source | Admitting: Family Medicine

## 2019-09-08 ENCOUNTER — Ambulatory Visit: Payer: No Typology Code available for payment source

## 2019-09-09 ENCOUNTER — Ambulatory Visit (HOSPITAL_COMMUNITY)
Admission: EM | Admit: 2019-09-09 | Discharge: 2019-09-09 | Disposition: A | Discharge status: Home / Self Care | Payer: No Typology Code available for payment source | Attending: Emergency Medicine | Admitting: Emergency Medicine

## 2019-09-09 ENCOUNTER — Other Ambulatory Visit: Payer: Self-pay

## 2019-09-09 ENCOUNTER — Encounter (HOSPITAL_COMMUNITY): Payer: Self-pay

## 2019-09-09 DIAGNOSIS — K648 Other hemorrhoids: Secondary | ICD-10-CM

## 2019-09-09 DIAGNOSIS — K625 Hemorrhage of anus and rectum: Secondary | ICD-10-CM

## 2019-09-09 MED ORDER — HYDROCORTISONE ACETATE 25 MG RE SUPP: 25.0000 mg | Freq: Two times a day (BID) | RECTAL | 0 refills | Status: DC

## 2019-09-09 NOTE — ED Triage Notes (Signed)
Pt is here with rectal bleeding that started yesterday, states she passed some clots and bloody. Pt has not taken anything to relieve discomfort, she has a hx of hemmoroids.

## 2019-09-09 NOTE — Discharge Instructions (Signed)
Please use Anusol suppositories twice daily until 48 hours after bleeding stops May use stool softener to help soften stools and help decrease straining Please drink 60 to 80 ounces of water daily Make sure you are incorporating fiber in your diet May try sitz bath-soaking in warm water  If you continue to have bleeding or develop any pain despite the use of the above please follow-up

## 2019-09-09 NOTE — ED Provider Notes (Signed)
[facility]    CSN: [id number] Arrival date & time: 09/09/19  1200      History   Chief Complaint Chief Complaint  Patient presents with  . Rectal Bleeding    HPI Lisa Woods is a 29 y.o. female presenting today for evaluation of rectal bleeding.  Patient notes that over the past 2 days she has noticed some rectal bleeding with bowel movements.  Denies bleeding rectally without straining.    She has noticed blood in the toilet as well as with wiping.  She denies any associated pain.  Has history of hemorrhoids.  Denies specific concerns around constipation.  Denies frequent straining or heavy lifting.  Denies family history of colon cancer.  She is currently on her menstrual cycle and has had some associated abdominal cramping, but no other abdominal pain or nausea or vomiting.  HPI  Past Medical History:  Diagnosis Date  . Chlamydia     Patient Active Problem List   Diagnosis Date Noted  . Thrombocytopenia ([place]) 02/04/2019  . Hidradenitis 01/26/2019  . Tobacco use disorder 12/30/2017  . Contraception management 03/12/2012    History reviewed. No pertinent surgical history.  OB History    Gravida  1   Para  1   Term  1   Preterm      AB      Living  1     SAB      TAB      Ectopic      Multiple      Live Births  1            Home Medications    Prior to Admission medications   Medication Sig Start Date End Date Taking? Authorizing Provider  hydrocortisone (ANUSOL-HC) 25 MG suppository Place 1 suppository (25 mg total) rectally 2 (two) times daily. 09/09/19   Jacoby Ritsema, Elesa Hacker, PA-C    Family History Family History  Problem Relation Age of Onset  . Cancer Mother        Breast Cancer  . Heart Problems Father     Social History Social History   Tobacco Use  . Smoking status: Current Some Day Smoker    Packs/day: 0.25    Types: Cigars  . Smokeless tobacco: Never Used  . Tobacco comment: only smokes 1 cig a day. Black and  mild.  Substance Use Topics  . Alcohol use: Yes    Comment: occasional   . Drug use: No     Allergies   Patient has no known allergies.   Review of Systems Review of Systems  Constitutional: Negative for fever.  Respiratory: Negative for shortness of breath.   Cardiovascular: Negative for chest pain.  Gastrointestinal: Positive for anal bleeding. Negative for abdominal pain, diarrhea, nausea, rectal pain and vomiting.  Genitourinary: Positive for vaginal bleeding. Negative for dysuria, flank pain, genital sores, hematuria, menstrual problem, vaginal discharge and vaginal pain.  Musculoskeletal: Negative for back pain.  Skin: Negative for rash.  Neurological: Negative for dizziness, light-headedness and headaches.     Physical Exam Triage Vital Signs ED Triage Vitals  Enc Vitals Group     BP 09/09/19 1227 111/71     Pulse Rate 09/09/19 1227 78     Resp 09/09/19 1227 16     Temp 09/09/19 1227 98.4 F (36.9 C)     Temp Source 09/09/19 1227 Oral     SpO2 09/09/19 1227 100 %     Weight 09/09/19 1224 162 lb (73.5  kg)     Height --      Head Circumference --      Peak Flow --      Pain Score 09/09/19 1224 0     Pain Loc --      Pain Edu? --      Excl. in GC? --    No data found.  Updated Vital Signs BP 111/71 (BP Location: Right Arm)   Pulse 78   Temp 98.4 F (36.9 C) (Oral)   Resp 16   Wt 162 lb (73.5 kg)   LMP 09/08/2019   SpO2 100%   BMI 26.96 kg/m   Visual Acuity Right Eye Distance:   Left Eye Distance:   Bilateral Distance:    Right Eye Near:   Left Eye Near:    Bilateral Near:     Physical Exam Vitals and nursing note reviewed.  Constitutional:      Appearance: She is well-developed.     Comments: No acute distress  HENT:     Head: Normocephalic and atraumatic.     Nose: Nose normal.  Eyes:     Conjunctiva/sclera: Conjunctivae normal.  Cardiovascular:     Rate and Rhythm: Normal rate.  Pulmonary:     Effort: Pulmonary effort is normal.  No respiratory distress.  Abdominal:     General: There is no distension.  Genitourinary:    Comments: Rectum with 4 areas with skin tag-like lesions likely from prior hemorrhoids, nontender to palpation, do not appear engorged or bleeding, DRE with no palpable masses, no significant tenderness to palpation of anus, slightly pink-tinged discoloration noted on glove Musculoskeletal:        General: Normal range of motion.     Cervical back: Neck supple.  Skin:    General: Skin is warm and dry.  Neurological:     Mental Status: She is alert and oriented to person, place, and time.      UC Treatments / Results  Labs (all labs ordered are listed, but only abnormal results are displayed) Labs Reviewed - No data to display  EKG   Radiology No results found.  Procedures Procedures (including critical care time)  Medications Ordered in UC Medications - No data to display  Initial Impression / Assessment and Plan / UC Course  I have reviewed the triage vital signs and the nursing notes.  Pertinent labs & imaging results that were available during my care of the patient were reviewed by me and considered in my medical decision making (see chart for details).     Suspect likely internal hemorrhoids. No sign of abscess. No pain. Will treat with Anusol suppositories as well as discussed at home remedies to try as well.  Advised if bleeding not resolving with treatment for hemorrhoids to follow-up in order to have referral placed for gastroenterology.  Discussed strict return precautions. Patient verbalized understanding and is agreeable with plan.  Final Clinical Impressions(s) / UC Diagnoses   Final diagnoses:  Rectal bleeding  Internal hemorrhoid, bleeding     Discharge Instructions     Please use Anusol suppositories twice daily until 48 hours after bleeding stops May use stool softener to help soften stools and help decrease straining Please drink 60 to 80 ounces of  water daily Make sure you are incorporating fiber in your diet May try sitz bath-soaking in warm water  If you continue to have bleeding or develop any pain despite the use of the above please follow-up   ED Prescriptions  Medication Sig Dispense Auth. Provider   hydrocortisone (ANUSOL-HC) 25 MG suppository Place 1 suppository (25 mg total) rectally 2 (two) times daily. 16 suppository Dontez Hauss, [place] C, PA-C     PDMP not reviewed this encounter.   Sharyon Cable [place] C, PA-C 09/09/19 1322

## 2019-09-26 ENCOUNTER — Ambulatory Visit (HOSPITAL_COMMUNITY)
Admission: EM | Admit: 2019-09-26 | Discharge: 2019-09-26 | Disposition: A | Discharge status: Home / Self Care | Payer: Self-pay | Attending: Physician Assistant | Admitting: Physician Assistant

## 2019-09-26 ENCOUNTER — Other Ambulatory Visit: Payer: Self-pay

## 2019-09-26 DIAGNOSIS — Z3202 Encounter for pregnancy test, result negative: Secondary | ICD-10-CM

## 2019-09-26 DIAGNOSIS — Z23 Encounter for immunization: Secondary | ICD-10-CM

## 2019-09-26 DIAGNOSIS — T148XXA Other injury of unspecified body region, initial encounter: Secondary | ICD-10-CM

## 2019-09-26 DIAGNOSIS — M549 Dorsalgia, unspecified: Secondary | ICD-10-CM

## 2019-09-26 DIAGNOSIS — M25511 Pain in right shoulder: Secondary | ICD-10-CM

## 2019-09-26 DIAGNOSIS — W503XXA Accidental bite by another person, initial encounter: Secondary | ICD-10-CM

## 2019-09-26 LAB — POC URINE PREG, ED: Preg Test, Ur: NEGATIVE

## 2019-09-26 MED ORDER — IBUPROFEN 600 MG PO TABS: 600.0000 mg | ORAL_TABLET | Freq: Four times a day (QID) | ORAL | 0 refills | Status: DC | PRN

## 2019-09-26 MED ORDER — TETANUS-DIPHTH-ACELL PERTUSSIS 5-2.5-18.5 LF-MCG/0.5 IM SUSP
INTRAMUSCULAR | Status: AC
  Filled 2019-09-26: qty 0.5

## 2019-09-26 MED ORDER — TETANUS-DIPHTH-ACELL PERTUSSIS 5-2.5-18.5 LF-MCG/0.5 IM SUSP
0.5000 mL | Freq: Once | INTRAMUSCULAR | Status: AC
  Administered 2019-09-26: 17:00:00 0.5 mL via INTRAMUSCULAR

## 2019-09-26 MED ORDER — TIZANIDINE HCL 4 MG PO TABS: 4.0000 mg | ORAL_TABLET | Freq: Four times a day (QID) | ORAL | 0 refills | Status: DC | PRN
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
## 2019-09-26 NOTE — Discharge Instructions (Signed)
Take the ibuprofen every 6 hours for the next 2 to 3 days and then as needed Take the Zanaflex primarily at night as will make you sleepy.  Do not drive, drink alcohol or go to work within 8 hours of taking this.  If you are going to be home and do not have to do any of those things you may take this prior to going to bed  Keep the abrasions and areas of the bites clean and monitor for infection.  If you see spreading redness, swelling or significant worsening pain please return for reevaluation  I expect you will be sore for a few days to a week, however you should have gradual improvement.  If you are not having any improvement or significant worsening in your shoulder or back pains please return or follow-up with your primary care.
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
## 2019-11-02 ENCOUNTER — Ambulatory Visit (INDEPENDENT_AMBULATORY_CARE_PROVIDER_SITE_OTHER): Payer: Self-pay | Admitting: Family Medicine

## 2019-11-02 ENCOUNTER — Other Ambulatory Visit (HOSPITAL_COMMUNITY)
Admission: RE | Admit: 2019-11-02 | Discharge: 2019-11-02 | Disposition: A | Discharge status: Home / Self Care | Payer: Self-pay | Source: Ambulatory Visit | Attending: Family Medicine | Admitting: Family Medicine

## 2019-11-02 ENCOUNTER — Other Ambulatory Visit: Payer: Self-pay

## 2019-11-02 VITALS — BP 112/60 | HR 86 | Ht 65.0 in | Wt 155.5 lb

## 2019-11-02 DIAGNOSIS — K648 Other hemorrhoids: Secondary | ICD-10-CM | POA: Insufficient documentation

## 2019-11-02 DIAGNOSIS — Z0000 Encounter for general adult medical examination without abnormal findings: Secondary | ICD-10-CM | POA: Insufficient documentation

## 2019-11-02 DIAGNOSIS — B9689 Other specified bacterial agents as the cause of diseases classified elsewhere: Secondary | ICD-10-CM | POA: Insufficient documentation

## 2019-11-02 DIAGNOSIS — K921 Melena: Secondary | ICD-10-CM

## 2019-11-02 DIAGNOSIS — Z124 Encounter for screening for malignant neoplasm of cervix: Secondary | ICD-10-CM

## 2019-11-02 DIAGNOSIS — N898 Other specified noninflammatory disorders of vagina: Secondary | ICD-10-CM

## 2019-11-02 DIAGNOSIS — N760 Acute vaginitis: Secondary | ICD-10-CM

## 2019-11-02 LAB — POCT WET PREP (WET MOUNT)
Clue Cells Wet Prep Whiff POC: NEGATIVE
Trichomonas Wet Prep HPF POC: ABSENT

## 2019-11-02 LAB — HEMOCCULT GUIAC POC 1CARD (OFFICE): Fecal Occult Blood, POC: NEGATIVE

## 2019-11-02 MED ORDER — HYDROCORTISONE ACETATE 25 MG RE SUPP: 25.0000 mg | Freq: Two times a day (BID) | RECTAL | 0 refills | Status: DC

## 2019-11-02 NOTE — Progress Notes (Signed)
    SUBJECTIVE:   CHIEF COMPLAINT / HPI:   Blood in stool  Endorsing intermittent bright red blood per rectum, ongoing since 4/16 for which she saw urgent care for.  Has history of internal hemorrhoids since giving birth 8 years ago that occasionally flareup.  Rectal exam urgent care significant for internal hemorrhoids, prescribed Anusol suppository the patient has not been able to obtain this due to high cost.  Has had BRBPR twice since 4/16.  Notes blood on toilet paper and in the bowl.  Denies pain, swelling.  Denies constipation or difficulties with bowel movement.  Has soft BM every day.  No family history of colon cancer.  Denies chest pain, shortness of breath.  Uses boric acid vaginal suppository for frequent BV which she wonders if contributes.  PERTINENT  PMH / PSH: Hidradenitis, tobacco use, thrombocytopenia  OBJECTIVE:   BP 112/60   Pulse 86   Ht 5\' 5"  (1.651 m)   Wt 155 lb 8 oz (70.5 kg)   LMP 10/08/2019   SpO2 97%   BMI 25.88 kg/m   Gen: well appearing, in NAD GYN:  External genitalia within normal limits.  Vaginal mucosa pink, moist, normal rugae.  Nonfriable cervix without lesions, moderate white discharge noted on speculum exam, no bleeding.  Bimanual exam revealed normal, nongravid uterus.  No cervical motion tenderness. No adnexal masses bilaterally.   Rectal: multiple skin tags around anal opening noted, no fissure. Rectal exam with good anal tone. Hemoccult negative.  Patient declined anoscopy exam.  ASSESSMENT/PLAN:   Internal hemorrhoids Known history with occasional flares. Hemoccult negative today. Offered anoscopy exam, however patient declined. No red flags to concern for malignancy, profuse GI bleed, upper GI bleed. No prior personal or family h/o inflammatory bowel disease. Coupon provided for 10/10/2019. Discussed definitive surgical management with band ligation, patient desires, referral placed.   Healthcare maintenance Pap smear and STI screening  obtained today, will call with results.  Bacterial vaginosis Recurrent per history, well managed with boric acid suppositories. Wet prep negative today. Also obtained STI testing per patient request, will call with results.    [employer], DO [facility]

## 2019-11-02 NOTE — Assessment & Plan Note (Signed)
Known history with occasional flares. Hemoccult negative today. Offered anoscopy exam, however patient declined. No red flags to concern for malignancy, profuse GI bleed, upper GI bleed. No prior personal or family h/o inflammatory bowel disease. Coupon provided for [employer]. Discussed definitive surgical management with band ligation, patient desires, referral placed.

## 2019-11-02 NOTE — Assessment & Plan Note (Signed)
Recurrent per history, well managed with boric acid suppositories. Wet prep negative today. Also obtained STI testing per patient request, will call with results.

## 2019-11-02 NOTE — Patient Instructions (Signed)
It was great to see you!  Our plans for today:  - If the anusol is too expensive, you can get hemorrhoid cream over the counter and place inside your anus to help achieve relief. - If you decide the bleeding is too much to deal with, let us know and we can refer you to surgery for ligation.   - We are checking some labs today, we will release these results to your MyChart.  Take care and seek immediate care sooner if you develop any concerns.   Dr. [facility]

## 2019-11-02 NOTE — Assessment & Plan Note (Signed)
Pap smear and STI screening obtained today, will call with results.

## 2019-11-04 LAB — CYTOLOGY - PAP: Diagnosis: NEGATIVE

## 2020-02-06 ENCOUNTER — Other Ambulatory Visit: Payer: Self-pay

## 2020-02-06 ENCOUNTER — Encounter (HOSPITAL_COMMUNITY): Payer: Self-pay | Admitting: Emergency Medicine

## 2020-02-06 ENCOUNTER — Ambulatory Visit (HOSPITAL_COMMUNITY)
Admission: EM | Admit: 2020-02-06 | Discharge: 2020-02-06 | Disposition: A | Discharge status: Home / Self Care | Payer: HRSA Program | Attending: Family Medicine | Admitting: Family Medicine

## 2020-02-06 DIAGNOSIS — Z20822 Contact with and (suspected) exposure to covid-19: Secondary | ICD-10-CM | POA: Diagnosis present

## 2020-02-06 NOTE — ED Triage Notes (Signed)
Pt presents with head congestions xs 1 week. Denies fever, n,v,d, chest pain, headache.  Pts daughter tested positive on Friday.

## 2020-02-06 NOTE — Discharge Instructions (Addendum)
Your COVID 19 results will be available in 24-48 hours. Negative results are immediately resulted to Mychart. Positive results will receive a follow-up call from our clinic. If symptoms are present, I recommend home quarantine until results are known. Recommendation management of viral illness include: Vitamin D 5,000 IU daily Vitamin C 500 mg twice daily Zinc 50 mg daily If you develop any symptoms of chest pressure chest pain or shortness of breath go immediately to the emergency department.

## 2020-02-08 LAB — NOVEL CORONAVIRUS, NAA (HOSP ORDER, SEND-OUT TO REF LAB; TAT 18-24 HRS)

## 2020-02-08 NOTE — ED Provider Notes (Signed)
[facility]    CSN: [id number] Arrival date & time: 02/06/20  0856      History   Chief Complaint Chief Complaint  Patient presents with  . Nasal Congestion    HPI Lisa Woods is a 29 y.o. female.   HPI Patient presents today following close exposure to COVID-19 as her daughter tested +5 days ago.  She has symptoms of ear congestion x1 week and associated nausea vomiting diarrhea chest tightness with breathing and headache.  She is afebrile on arrival.  She is in no distress. Past Medical History:  Diagnosis Date  . Chlamydia     Patient Active Problem List   Diagnosis Date Noted  . Internal hemorrhoids 11/02/2019  . Healthcare maintenance 11/02/2019  . Bacterial vaginosis 11/02/2019  . Thrombocytopenia (HCC) 02/04/2019  . Hidradenitis 01/26/2019  . Tobacco use disorder 12/30/2017  . Contraception management 03/12/2012    History reviewed. No pertinent surgical history.  OB History    Gravida  1   Para  1   Term  1   Preterm      AB      Living  1     SAB      TAB      Ectopic      Multiple      Live Births  1            Home Medications    Prior to Admission medications   Medication Sig Start Date End Date Taking? Authorizing Provider  hydrocortisone (ANUSOL-HC) 25 MG suppository Place 1 suppository (25 mg total) rectally 2 (two) times daily. 11/02/19   Ellwood Dense, MD  ibuprofen (ADVIL) 600 MG tablet Take 1 tablet (600 mg total) by mouth every 6 (six) hours as needed. 09/26/19   Darr, Veryl Speak, PA-C  tiZANidine (ZANAFLEX) 4 MG tablet Take 1 tablet (4 mg total) by mouth every 6 (six) hours as needed for muscle spasms. 09/26/19   Darr, Veryl Speak, PA-C    Family History Family History  Problem Relation Age of Onset  . Cancer Mother        Breast Cancer  . Heart Problems Father     Social History Social History   Tobacco Use  . Smoking status: Current Some Day Smoker    Packs/day: 0.25    Types: Cigars  . Smokeless  tobacco: Never Used  . Tobacco comment: only smokes 1 cig a day. Black and mild.  Substance Use Topics  . Alcohol use: Yes    Comment: occasional   . Drug use: No     Allergies   Patient has no known allergies.   Review of Systems Review of Systems Pertinent negatives listed in HPI   Physical Exam Triage Vital Signs ED Triage Vitals  Enc Vitals Group     BP 02/06/20 1014 123/76     Pulse Rate 02/06/20 1014 75     Resp 02/06/20 1014 17     Temp 02/06/20 1014 98.7 F (37.1 C)     Temp Source 02/06/20 1014 Oral     SpO2 02/06/20 1014 99 %     Weight --      Height --      Head Circumference --      Peak Flow --      Pain Score 02/06/20 1013 0     Pain Loc --      Pain Edu? --      Excl. in GC? --  No data found.  Updated Vital Signs BP 123/76 (BP Location: Right Arm)   Pulse 75   Temp 98.7 F (37.1 C) (Oral)   Resp 17   LMP 01/08/2020   SpO2 99%   Visual Acuity Right Eye Distance:   Left Eye Distance:   Bilateral Distance:    Right Eye Near:   Left Eye Near:    Bilateral Near:     Physical Exam General appearance: alert, well developed, well nourished, cooperative and in no distress Head: Normocephalic, without obvious abnormality, atraumatic Respiratory: Respirations even and unlabored, normal respiratory rate Heart: rate and rhythm normal. No gallop or murmurs noted on exam  Extremities: No gross deformities Skin: Skin color, texture, turgor normal. No rashes seen  Psych: Appropriate mood and affect.   UC Treatments / Results  Labs (all labs ordered are listed, but only abnormal results are displayed) Labs Reviewed  NOVEL CORONAVIRUS, NAA (HOSP ORDER, SEND-OUT TO REF LAB; TAT 18-24 HRS) - Abnormal; Notable for the following components:      Result Value   SARS-CoV-2, NAA Comment (*)    All other components within normal limits    EKG   Radiology No results found.  Procedures Procedures (including critical care time)  Medications  Ordered in UC Medications - No data to display  Initial Impression / Assessment and Plan / UC Course  I have reviewed the triage vital signs and the nursing notes.  Pertinent labs & imaging results that were available during my care of the patient were reviewed by me and considered in my medical decision making (see chart for details).    COVID-19 test pending.  Patient's vital signs are reassuring.  Home quarantine until results are known.  COVID-19 test is pending. Final Clinical Impressions(s) / UC Diagnoses   Final diagnoses:  Close exposure to COVID-19 virus     Discharge Instructions     Your COVID 19 results will be available in 24-48 hours. Negative results are immediately resulted to Mychart. Positive results will receive a follow-up call from our clinic. If symptoms are present, I recommend home quarantine until results are known. Recommendation management of viral illness include: Vitamin D 5,000 IU daily Vitamin C 500 mg twice daily Zinc 50 mg daily If you develop any symptoms of chest pressure chest pain or shortness of breath go immediately to the emergency department.    ED Prescriptions    None     PDMP not reviewed this encounter.   Bing Neighbors, FNP 02/08/20 2355

## 2020-04-03 ENCOUNTER — Other Ambulatory Visit: Payer: Self-pay

## 2020-04-03 ENCOUNTER — Ambulatory Visit (HOSPITAL_COMMUNITY)
Admission: EM | Admit: 2020-04-03 | Discharge: 2020-04-03 | Disposition: A | Discharge status: Home / Self Care | Payer: Self-pay | Attending: Family Medicine | Admitting: Family Medicine

## 2020-04-03 ENCOUNTER — Encounter (HOSPITAL_COMMUNITY): Payer: Self-pay | Admitting: Emergency Medicine

## 2020-04-03 DIAGNOSIS — R21 Rash and other nonspecific skin eruption: Secondary | ICD-10-CM

## 2020-04-03 MED ORDER — PERMETHRIN 5 % EX CREA: TOPICAL_CREAM | CUTANEOUS | 1 refills | Status: DC

## 2020-04-03 NOTE — Discharge Instructions (Addendum)
Treating you for possible scabies Medicine as prescribed Wash all clothes and bedding in hot water.  Follow up as needed for continued or worsening symptoms  

## 2020-04-03 NOTE — ED Provider Notes (Signed)
[facility]    CSN: [id number] Arrival date & time: 04/03/20  1001      History   Chief Complaint Chief Complaint  Patient presents with  . Insect Bite    HPI Lisa Woods is a 29 y.o. female.   Patient is a 29 year old female presents today with rash and possible bug bites.  This is located to bilateral arms.  Started after staying at some family members houses this weekend.  The rash is very itchy.  Her daughter also has similar rash.  She is been using Benadryl cream and cleaning with alcohol.  Denies any fever, joint pain. Denies any recent changes in lotions, detergents, foods or other possible irritants. No recent travel. Patient has been outside but denies any contact with plants or insects. No new foods or medications.        Past Medical History:  Diagnosis Date  . Chlamydia     Patient Active Problem List   Diagnosis Date Noted  . Internal hemorrhoids 11/02/2019  . Healthcare maintenance 11/02/2019  . Bacterial vaginosis 11/02/2019  . Thrombocytopenia (HCC) 02/04/2019  . Hidradenitis 01/26/2019  . Tobacco use disorder 12/30/2017  . Contraception management 03/12/2012    History reviewed. No pertinent surgical history.  OB History    Gravida  1   Para  1   Term  1   Preterm      AB      Living  1     SAB      TAB      Ectopic      Multiple      Live Births  1            Home Medications    Prior to Admission medications   Medication Sig Start Date End Date Taking? Authorizing Provider  permethrin (ELIMITE) 5 % cream Apply from neck down. Leave on for 12 hours and then wash off. Repeat in 2 weeks if needed. 04/03/20   Janace Aris, NP    Family History Family History  Problem Relation Age of Onset  . Cancer Mother        Breast Cancer  . Heart Problems Father     Social History Social History   Tobacco Use  . Smoking status: Current Some Day Smoker    Packs/day: 0.25    Types: Cigars  . Smokeless  tobacco: Never Used  . Tobacco comment: only smokes 1 cig a day. Black and mild.  Substance Use Topics  . Alcohol use: Yes    Comment: occasional   . Drug use: No     Allergies   Patient has no known allergies.   Review of Systems Review of Systems   Physical Exam Triage Vital Signs ED Triage Vitals  Enc Vitals Group     BP 04/03/20 1132 113/70     Pulse Rate 04/03/20 1132 61     Resp 04/03/20 1132 16     Temp 04/03/20 1132 98.1 F (36.7 C)     Temp Source 04/03/20 1132 Oral     SpO2 04/03/20 1132 100 %     Weight --      Height --      Head Circumference --      Peak Flow --      Pain Score 04/03/20 1126 0     Pain Loc --      Pain Edu? --      Excl. in GC? --  No data found.  Updated Vital Signs BP 113/70   Pulse 61   Temp 98.1 F (36.7 C) (Oral)   Resp 16   LMP 03/27/2020   SpO2 100%   Visual Acuity Right Eye Distance:   Left Eye Distance:   Bilateral Distance:    Right Eye Near:   Left Eye Near:    Bilateral Near:     Physical Exam Vitals and nursing note reviewed.  Constitutional:      General: She is not in acute distress.    Appearance: Normal appearance. She is not ill-appearing, toxic-appearing or diaphoretic.  HENT:     Head: Normocephalic.     Nose: Nose normal.  Eyes:     Conjunctiva/sclera: Conjunctivae normal.  Pulmonary:     Effort: Pulmonary effort is normal.  Musculoskeletal:        General: Normal range of motion.     Cervical back: Normal range of motion.  Skin:    General: Skin is warm and dry.     Findings: Rash present.     Comments: Erythematous papular rash to bilateral arms Some wheals  Neurological:     Mental Status: She is alert.  Psychiatric:        Mood and Affect: Mood normal.      UC Treatments / Results  Labs (all labs ordered are listed, but only abnormal results are displayed) Labs Reviewed - No data to display  EKG   Radiology No results found.  Procedures Procedures (including  critical care time)  Medications Ordered in UC Medications - No data to display  Initial Impression / Assessment and Plan / UC Course  I have reviewed the triage vital signs and the nursing notes.  Pertinent labs & imaging results that were available during my care of the patient were reviewed by me and considered in my medical decision making (see chart for details).     Rash Treating for possible scabies.  Could be bed bug bites  Medicine as prescribed instructions given.  Follow up as needed for continued or worsening symptoms  Final Clinical Impressions(s) / UC Diagnoses   Final diagnoses:  Rash     Discharge Instructions     Treating you for possible scabies Medicine as prescribed Wash all clothes and bedding in hot water.  Follow up as needed for continued or worsening symptoms     ED Prescriptions    Medication Sig Dispense Auth. Provider   permethrin (ELIMITE) 5 % cream  (Status: Discontinued) Apply from neck down. Leave on for 12 hours and then wash off. Repeat in 2 weeks if needed. 60 g Annarae Macnair A, NP   permethrin (ELIMITE) 5 % cream Apply from neck down. Leave on for 12 hours and then wash off. Repeat in 2 weeks if needed. 60 g Dahlia Byes A, NP     PDMP not reviewed this encounter.   Janace Aris, NP 04/03/20 1207

## 2020-04-03 NOTE — ED Triage Notes (Signed)
Pt has stayed with multiple family members recently, repotrs suspected bed bug bites to left arm, and left leg.

## 2020-05-26 NOTE — L&D Delivery Note (Addendum)
  GME ATTESTATION:  I was gowned and gloved during entire delivery and as per delivery note and delivered the posterior shoulder (while suprapubic pressure and McRoberts maneuver being done) during the shoulder dystocia.  I agree with the findings and the plan of care as documented in the resident's note below and have made all necessary edits.  Warner Mccreedy, MD, MPH OB Fellow, Faculty Practice [facility], [facility]   OB/GYN Faculty Practice Delivery Note  Lisa Woods is a 30 y.o. G2P1001 s/p SVD at [redacted]w[redacted]d. She was admitted for SOL.   ROM: 16h 65m with moderate to thick meconium fluid GBS Status: negative Maximum Maternal Temperature: 100.1   Labor Progress: Patient presenting in SOL. Labor was augmented with pitocin and AROM notable for mec. Patient with Tmax of 100.1 and fetal tachycardia to 180s. Amp/gent, and tylenol were given to treat chorio/Triple I. Fetal tachycardia improved. Patient progressed to complete with pitocin.  Delivery Date/Time: [phone] Delivery: Called to room and patient was complete and pushing. Head delivered ROA. Turttling appreciated and head did not restitute. At that time McRoberts and suprapubic pressure were performed while Dr. Ephriam Jenkins delivered posterior shoulder and arm successfully which then led to delivery of body with ease.. No nuchal cord present .Infant appeared stunned with suboptimal tone. Cord was immediately clamped and cut by myself. Cord blood drawn. Placenta delivered spontaneously with gentle cord traction. Fundus firm with massage and Pitocin. Labia, perineum, vagina, and cervix were inspected, with no lacerations.   Placenta: membranes intact Complications: Shoulder dystocia Lacerations: none EBL: 200 Analgesia: epidural  Infant: Baby Girl  APGARs 7,9  3940 gm weight  Betti Cruz, MD PGY-2 Family Medicine Resident

## 2020-07-03 ENCOUNTER — Ambulatory Visit (INDEPENDENT_AMBULATORY_CARE_PROVIDER_SITE_OTHER): Payer: Self-pay

## 2020-07-03 ENCOUNTER — Other Ambulatory Visit: Payer: Self-pay

## 2020-07-03 DIAGNOSIS — Z111 Encounter for screening for respiratory tuberculosis: Secondary | ICD-10-CM

## 2020-07-03 NOTE — Progress Notes (Signed)
Tuberculin skin test applied to left ventral forearm.  Patient to return on 02/10 at 4pm to have site read.

## 2020-07-05 ENCOUNTER — Other Ambulatory Visit: Payer: Self-pay

## 2020-07-05 ENCOUNTER — Ambulatory Visit (INDEPENDENT_AMBULATORY_CARE_PROVIDER_SITE_OTHER): Payer: Self-pay

## 2020-07-05 DIAGNOSIS — Z111 Encounter for screening for respiratory tuberculosis: Secondary | ICD-10-CM

## 2020-07-05 LAB — TB SKIN TEST
Induration: 0 mm
TB Skin Test: NEGATIVE

## 2020-07-05 NOTE — Progress Notes (Signed)
Patient is here for a PPD read.  It was placed on 07/03/2020 in the left forearm @ 1548 am/pm.    PPD RESULTS:  Result: negative Induration: 0 mm  Letter created and given to patient for documentation purposes. Veronda Prude, RN

## 2020-09-20 ENCOUNTER — Ambulatory Visit (HOSPITAL_COMMUNITY)
Admission: EM | Admit: 2020-09-20 | Discharge: 2020-09-20 | Disposition: A | Discharge status: Home / Self Care | Payer: Self-pay | Attending: Internal Medicine | Admitting: Internal Medicine

## 2020-09-20 ENCOUNTER — Other Ambulatory Visit: Payer: Self-pay

## 2020-09-20 ENCOUNTER — Ambulatory Visit (HOSPITAL_COMMUNITY)
Admission: EM | Admit: 2020-09-20 | Discharge: 2020-09-20 | Disposition: A | Discharge status: Home / Self Care | Payer: Self-pay | Attending: Family Medicine | Admitting: Family Medicine

## 2020-09-20 ENCOUNTER — Encounter (HOSPITAL_COMMUNITY): Payer: Self-pay | Admitting: Emergency Medicine

## 2020-09-20 DIAGNOSIS — Z3201 Encounter for pregnancy test, result positive: Secondary | ICD-10-CM

## 2020-09-20 LAB — POCT URINALYSIS DIPSTICK, ED / UC
Bilirubin Urine: NEGATIVE
Glucose, UA: NEGATIVE mg/dL
Ketones, ur: NEGATIVE mg/dL
Leukocytes,Ua: NEGATIVE
Nitrite: NEGATIVE
Protein, ur: NEGATIVE mg/dL
Specific Gravity, Urine: 1.02 (ref 1.005–1.030)
Urobilinogen, UA: 0.2 mg/dL (ref 0.0–1.0)
pH: 5.5 (ref 5.0–8.0)

## 2020-09-20 LAB — POC URINE PREG, ED: Preg Test, Ur: POSITIVE — AB

## 2020-09-20 MED ORDER — PRENATAL VITAMINS 28-0.8 MG PO TABS: 1.0000 | ORAL_TABLET | Freq: Every day | ORAL | 2 refills | Status: DC

## 2020-09-20 NOTE — ED Notes (Signed)
Lisa Woods, patient access reports patient left due to wait time

## 2020-09-20 NOTE — Discharge Instructions (Signed)
Please make arrangements for follow up with your Ob-Gyn providers

## 2020-09-20 NOTE — ED Provider Notes (Signed)
[facility]    CSN: [id number] Arrival date & time: 09/20/20  1411      History   Chief Complaint Chief Complaint  Patient presents with  . Nausea  . Constipation  . Possible Pregnancy    HPI Lisa Woods is a 30 y.o. female comes to the urgent care requesting a pregnancy test.  Patient last menstrual period is unknown but has been over a few months.  She is complaining of some nausea and constipation.  No abdominal pain.  No vaginal bleeding.  Patient is requesting pregnancy test.   HPI  Past Medical History:  Diagnosis Date  . Chlamydia     Patient Active Problem List   Diagnosis Date Noted  . Internal hemorrhoids 11/02/2019  . Healthcare maintenance 11/02/2019  . Bacterial vaginosis 11/02/2019  . Thrombocytopenia (HCC) 02/04/2019  . Hidradenitis 01/26/2019  . Tobacco use disorder 12/30/2017  . Contraception management 03/12/2012    History reviewed. No pertinent surgical history.  OB History    Gravida  1   Para  1   Term  1   Preterm      AB      Living  1     SAB      IAB      Ectopic      Multiple      Live Births  1            Home Medications    Prior to Admission medications   Medication Sig Start Date End Date Taking? Authorizing Provider  Prenatal Vit-Fe Fumarate-FA (PRENATAL VITAMINS) 28-0.8 MG TABS Take 1 tablet by mouth daily. 09/20/20  Yes Axil Copeman, Britta Mccreedy, MD    Family History Family History  Problem Relation Age of Onset  . Cancer Mother        Breast Cancer  . Heart Problems Father     Social History Social History   Tobacco Use  . Smoking status: Current Some Day Smoker    Packs/day: 0.25    Types: Cigars  . Smokeless tobacco: Never Used  . Tobacco comment: only smokes 1 cig a day. Black and mild.  Vaping Use  . Vaping Use: Never used  Substance Use Topics  . Alcohol use: Yes    Comment: occasional   . Drug use: No     Allergies   Patient has no known allergies.   Review of  Systems Review of Systems  Gastrointestinal: Positive for constipation and nausea.  Endocrine: Negative for cold intolerance, heat intolerance, polydipsia, polyphagia and polyuria.  Genitourinary: Negative for vaginal bleeding, vaginal discharge and vaginal pain.  Musculoskeletal: Negative.      Physical Exam Triage Vital Signs ED Triage Vitals  Enc Vitals Group     BP 09/20/20 1500 112/64     Pulse Rate 09/20/20 1500 78     Resp 09/20/20 1500 16     Temp 09/20/20 1500 98.5 F (36.9 C)     Temp Source 09/20/20 1500 Oral     SpO2 09/20/20 1500 98 %     Weight --      Height --      Head Circumference --      Peak Flow --      Pain Score 09/20/20 1458 0     Pain Loc --      Pain Edu? --      Excl. in GC? --    No data found.  Updated Vital Signs BP 112/64 (BP Location:  Left Arm)   Pulse 78   Temp 98.5 F (36.9 C) (Oral)   Resp 16   LMP  (LMP Unknown)   SpO2 98%   Visual Acuity Right Eye Distance:   Left Eye Distance:   Bilateral Distance:    Right Eye Near:   Left Eye Near:    Bilateral Near:     Physical Exam Vitals and nursing note reviewed.  Constitutional:      General: She is not in acute distress.    Appearance: She is not ill-appearing.  Cardiovascular:     Rate and Rhythm: Normal rate and regular rhythm.     Pulses: Normal pulses.     Heart sounds: Normal heart sounds.  Pulmonary:     Effort: Pulmonary effort is normal.     Breath sounds: Normal breath sounds.  Neurological:     Mental Status: She is alert.      UC Treatments / Results  Labs (all labs ordered are listed, but only abnormal results are displayed) Labs Reviewed  POC URINE PREG, ED - Abnormal; Notable for the following components:      Result Value   Preg Test, Ur POSITIVE (*)    All other components within normal limits  POCT URINALYSIS DIPSTICK, ED / UC - Abnormal; Notable for the following components:   Hgb urine dipstick TRACE (*)    All other components within normal  limits    EKG   Radiology No results found.  Procedures Procedures (including critical care time)  Medications Ordered in UC Medications - No data to display  Initial Impression / Assessment and Plan / UC Course  I have reviewed the triage vital signs and the nursing notes.  Pertinent labs & imaging results that were available during my care of the patient were reviewed by me and considered in my medical decision making (see chart for details).     1.  Positive pregnancy test: Prenatal vitamins Point-of-care urine pregnancy test is positive Point-of-care urinalysis is negative for UTI Patient is advised to establish care with obstetrics Patient verbalizes understanding of the plan of care Final Clinical Impressions(s) / UC Diagnoses   Final diagnoses:  Positive pregnancy test     Discharge Instructions     Please make arrangements for follow up with your Ob-Gyn providers   ED Prescriptions    Medication Sig Dispense Auth. Provider   Prenatal Vit-Fe Fumarate-FA (PRENATAL VITAMINS) 28-0.8 MG TABS Take 1 tablet by mouth daily. 90 tablet Seaton Hofmann, Britta Mccreedy, MD     PDMP not reviewed this encounter.   Merrilee Jansky, MD 09/20/20 [phone]

## 2020-09-20 NOTE — ED Triage Notes (Signed)
Pt presents today with c/o of nausea, constipation and possible pregancy. LMP unknown.

## 2020-10-02 ENCOUNTER — Encounter: Payer: Self-pay | Admitting: Family Medicine

## 2020-10-12 ENCOUNTER — Other Ambulatory Visit (HOSPITAL_COMMUNITY)
Admission: RE | Admit: 2020-10-12 | Discharge: 2020-10-12 | Disposition: A | Discharge status: Home / Self Care | Payer: Medicaid Other | Source: Ambulatory Visit | Attending: Family Medicine | Admitting: Family Medicine

## 2020-10-12 ENCOUNTER — Ambulatory Visit (INDEPENDENT_AMBULATORY_CARE_PROVIDER_SITE_OTHER): Payer: Self-pay | Admitting: Family Medicine

## 2020-10-12 ENCOUNTER — Other Ambulatory Visit: Payer: Self-pay

## 2020-10-12 ENCOUNTER — Encounter: Payer: Self-pay | Admitting: Family Medicine

## 2020-10-12 VITALS — BP 125/72 | HR 93 | Wt 161.2 lb

## 2020-10-12 DIAGNOSIS — Z3480 Encounter for supervision of other normal pregnancy, unspecified trimester: Secondary | ICD-10-CM | POA: Diagnosis not present

## 2020-10-12 LAB — POCT 1 HR PRENATAL GLUCOSE: Glucose 1 Hr Prenatal, POC: 96 mg/dL

## 2020-10-12 MED ORDER — PRENATAL MULTIVITAMIN CH: 1.0000 | ORAL_TABLET | Freq: Every day | ORAL | 9 refills | Status: DC

## 2020-10-12 NOTE — Patient Instructions (Addendum)
Congratulations!!! It was nice meeting your today.   Please check-out at the front desk before leaving the clinic. I'd like to see you back in 4 weeks but if you need to be seen earlier than that for any new issues we're happy to fit you in, just give [place] a call!  Stop by the front desk to get your forms for Adopt a Mom application/information. Call [phone] for more information. Go to your scheduled ultrasound at the health department. Your Monday ultrasound appointment is June 6th at 10:30 am  Visit Remembers: - Continue taking your prenatal vitamins.  - Continue to work on your healthy eating habits and incorporating exercise into your daily life.  - Your goal is to have an BP <120/80 - After your dating ultrasound we can get your genetic testing   Regarding lab work today:  Due to recent changes in healthcare laws, you may see the results of your imaging and laboratory studies on MyChart before your provider has had a chance to review them.  I understand that in some cases there may be results that are confusing or concerning to you. Not all laboratory results come back in the same time frame and you may be waiting for multiple results in order to interpret others.  Please give [place] 72 hours in order for your provider to thoroughly review all the results before contacting the office for clarification of your results. If everything is normal, you will get a letter in the mail or a message in My Chart. Please give [place] a call if you do not hear from [place] after 2 weeks.  Feel free to call with any questions or concerns at any time, at [phone].   Take care,  Dr. [facility]   Obstetrics: Normal and Problem Pregnancies (7th ed., pp. 102-121). [place], [place]: Elsevier."> Textbook of Family Medicine (9th ed., pp. [phone]). [place], [place]: Elsevier [employer].">  First Trimester of Pregnancy  The first trimester of pregnancy starts on the first day of your last  menstrual period until the end of week 12. This is months 1 through 3 of pregnancy. A week after a sperm fertilizes an egg, the egg will implant into the wall of the uterus and begin to develop into a baby. By the end of 12 weeks, all the baby's organs will be formed and the baby will be 2-3 inches in size. Body changes during your first trimester Your body goes through many changes during pregnancy. The changes vary and generally return to normal after your baby is born. Physical changes  You may gain or lose weight.  Your breasts may begin to grow larger and become tender. The tissue that surrounds your nipples (areola) may become darker.  Dark spots or blotches (chloasma or mask of pregnancy) may develop on your face.  You may have changes in your hair. These can include thickening or thinning of your hair or changes in texture. Health changes  You may feel nauseous, and you may vomit.  You may have heartburn.  You may develop headaches.  You may develop constipation.  Your gums may bleed and may be sensitive to brushing and flossing. Other changes  You may tire easily.  You may urinate more often.  Your menstrual periods will stop.  You may have a loss of appetite.  You may develop cravings for certain kinds of food.  You may have changes in your emotions from day to day.  You may have more vivid  and strange dreams. Follow these instructions at home: Medicines  Follow your health care provider's instructions regarding medicine use. Specific medicines may be either safe or unsafe to take during pregnancy. Do not take any medicines unless told to by your health care provider.  Take a prenatal vitamin that contains at least 600 micrograms (mcg) of folic acid. Eating and drinking  Eat a healthy diet that includes fresh fruits and vegetables, whole grains, good sources of protein such as meat, eggs, or tofu, and low-fat dairy products.  Avoid raw meat and unpasteurized  juice, milk, and cheese. These carry germs that can harm you and your baby.  If you feel nauseous or you vomit: ? Eat 4 or 5 small meals a day instead of 3 large meals. ? Try eating a few soda crackers. ? Drink liquids between meals instead of during meals.  You may need to take these actions to prevent or treat constipation: ? Drink enough fluid to keep your urine pale yellow. ? Eat foods that are high in fiber, such as beans, whole grains, and fresh fruits and vegetables. ? Limit foods that are high in fat and processed sugars, such as fried or sweet foods. Activity  Exercise only as directed by your health care provider. Most people can continue their usual exercise routine during pregnancy. Try to exercise for 30 minutes at least 5 days a week.  Stop exercising if you develop pain or cramping in the lower abdomen or lower back.  Avoid exercising if it is very hot or humid or if you are at high altitude.  Avoid heavy lifting.  If you choose to, you may have sex unless your health care provider tells you not to. Relieving pain and discomfort  Wear a good support bra to relieve breast tenderness.  Rest with your legs elevated if you have leg cramps or low back pain.  If you develop bulging veins (varicose veins) in your legs: ? Wear support hose as told by your health care provider. ? Elevate your feet for 15 minutes, 3-4 times a day. ? Limit salt in your diet. Safety  Wear your seat belt at all times when driving or riding in a car.  Talk with your health care provider if someone is verbally or physically abusive to you.  Talk with your health care provider if you are feeling sad or have thoughts of hurting yourself. Lifestyle  Do not use hot tubs, steam rooms, or saunas.  Do not douche. Do not use tampons or scented sanitary pads.  Do not use herbal remedies, alcohol, illegal drugs, or medicines that are not approved by your health care provider. Chemicals in these  products can harm your baby.  Do not use any products that contain nicotine or tobacco, such as cigarettes, e-cigarettes, and chewing tobacco. If you need help quitting, ask your health care provider.  Avoid cat litter boxes and soil used by cats. These carry germs that can cause birth defects in the baby and possibly loss of the unborn baby (fetus) by miscarriage or stillbirth. General instructions  During routine prenatal visits in the first trimester, your health care provider will do a physical exam, perform necessary tests, and ask you how things are going. Keep all follow-up visits. This is important.  Ask for help if you have counseling or nutritional needs during pregnancy. Your health care provider can offer advice or refer you to specialists for help with various needs.  Schedule a dentist appointment. At home, brush  your teeth with a soft toothbrush. Floss gently.  Write down your questions. Take them to your prenatal visits. Where to find more information  American Pregnancy Association: americanpregnancy.org  [employer]necologists: https://www.todd-brady.net/  Office on [employer] Health: MightyReward.co.nz Contact a health care provider if you have:  Dizziness.  A fever.  Mild pelvic cramps, pelvic pressure, or nagging pain in the abdominal area.  Nausea, vomiting, or diarrhea that lasts for 24 hours or longer.  A bad-smelling vaginal discharge.  Pain when you urinate.  Known exposure to a contagious illness, such as chickenpox, measles, Zika virus, HIV, or hepatitis. Get help right away if you have:  Spotting or bleeding from your vagina.  Severe abdominal cramping or pain.  Shortness of breath or chest pain.  Any kind of trauma, such as from a fall or a car crash.  New or increased pain, swelling, or redness in an arm or leg. Summary  The first trimester of pregnancy starts on the first day of your last  menstrual period until the end of week 12 (months 1 through 3).  Eating 4 or 5 small meals a day rather than 3 large meals may help to relieve nausea and vomiting.  Do not use any products that contain nicotine or tobacco, such as cigarettes, e-cigarettes, and chewing tobacco. If you need help quitting, ask your health care provider.  Keep all follow-up visits. This is important. This information is not intended to replace advice given to you by your health care provider. Make sure you discuss any questions you have with your health care provider. Document Revised: 10/19/2019 Document Reviewed: 08/25/2019 Elsevier Patient Education  2021 [employer].

## 2020-10-12 NOTE — Progress Notes (Signed)
Patient Name: Lisa Woods Date of Birth: 10/19/1990 [facility] Initial Prenatal Visit  Lisa Woods is a 30 y.o. year old G2P1001 at Unknown who presents for her initial prenatal visit. Pregnancy is planned She reports breast tenderness, fatigue, nausea and positive home pregnancy test. She is taking a prenatal vitamin.  She denies pelvic pain or vaginal bleeding.   Pregnancy Dating: . The patient is dated by .  Marland Kitchen LMP: unsure but in the beginning March  . Period is certain:  No.  . Periods were regular:  Yes.  Marland Kitchen LMP was a typical period:  Yes.  . Using hormonal contraception in 3 months prior to conception: No  Lab Review: . Blood type: AB POS . Rh Status: obtained today  . Antibody screen: Obtained today  . HIV: Obtained today  . RPR: Obtained today  . Hemoglobin electrophoresis reviewed: Obtained today  . Results of OB urine culture are:  Obtained today  . Rubella: obtained today  . Varicella status is unknown. Obtained today.   PMH: Reviewed and as detailed below: . HTN: No  . Type 1 or 2 Diabetes: No  . Depression:  No  . Seizure disorder:  No . VTE: No ,  . History of STI No,  . Abnormal Pap smear:  No, . Genital herpes simplex:  No   PSH: . Gynecologic Surgery:  no . Surgical history reviewed, notable for: none  Obstetric History: . Obstetric history tab updated and reviewed.  . Summary of prior pregnancies: Has a <7 lb 6 oz baby girl in 2013 via vaginal delivery required episiotomy   . Cesarean delivery: No  . Gestational Diabetes:  No . Hypertension in pregnancy: No . History of preterm birth: No . History of LGA/SGA infant:  No . History of shoulder dystocia: No . Indications for referral were reviewed, and the patient has no obstetric indications for referral to High Risk OB Clinic at this time.   Social History: . Partner's name: Philomena Course . Tobacco use: Not currently. Stopped when she found out she was pregnant.  . Alcohol  use:  No . Other substance use:  No  Current Medications:  . Prenatal vitamins  . Reviewed and appropriate in pregnancy.   Genetic and Infection Screen: . Flow Sheet Updated Yes  Prenatal Exam: Gen: Well nourished, well developed.  No distress.  Vitals noted. HEENT: Normocephalic, atraumatic.  Neck supple without cervical lymphadenopathy, thyromegaly or thyroid nodules.  Fair dentition. CV: RRR no murmur, gallops or rubs Lungs: CTA B.  Normal respiratory effort without wheezes or rales. Abd: soft, NTND. +BS.  Uterus not appreciated above pelvis. GU: Normal external female genitalia without lesions.  Nl vaginal, well rugated without lesions. No vaginal discharge.  Bimanual exam: No adnexal mass or TTP. No CMT.  Uterus below the umbilicus Ext: No clubbing, cyanosis or edema. Psych: Normal grooming and dress.  Not depressed or anxious appearing.  Normal thought content and process without flight of ideas or looseness of associations  Fetal heart tones: Not attempted, dating unknown   Assessment/Plan:  Lisa Woods is a 30 y.o. G2P1001 at Unknown who presents to initiate prenatal care. She is doing well.  Current pregnancy issues include uninsured mother.   1. Routine prenatal care: Marland Kitchen As dating is not reliable, a dating ultrasound has been ordered. Dating tab updated. . Pre-pregnancy weight updated. Expected weight gain this pregnancy is 15-25 pounds . Prenatal labs ordered today.  . Indications for referral to [facility]  were reviewed and the patient does not meet criteria for referral.  . Medication list reviewed and updated.  . Recommended patient see a dentist for regular care.  . Bleeding and pain precautions reviewed. . Importance of prenatal vitamins reviewed.  . Genetic screening offered. Patient opted for: genetic screening. Dating unsure will defer obtaining screening today.  . The patient will not be age 29 or over at time of delivery. Referral to genetic counseling was not  offered today.  . The patient has the following risk factors for preexisting diabetes: BMI > 25 and high risk ethnicity (Latino, [place] American, Native American, Mal[place] Islander, Asian Naval architect) . An early 1 hour glucose tolerance test was ordered. . Pregnancy Medical Home and PHQ-9 forms completed, problems noted: Yes  2. Pregnancy issues  . Uninsured Mother -  Given handout regarding Adopt-a-mom program through the health department.  . Patient will need to start low dose aspirin for Pre-E ppx at 12 weeks.    Follow up 4 weeks for next prenatal visit.

## 2020-10-14 LAB — URINE CULTURE, OB REFLEX

## 2020-10-14 LAB — CULTURE, OB URINE

## 2020-10-15 LAB — HGB FRACTIONATION CASCADE
Hgb A2: 2.8 % (ref 1.8–3.2)
Hgb A: 97.2 % (ref 96.4–98.8)
Hgb F: 0 % (ref 0.0–2.0)
Hgb S: 0 %

## 2020-10-15 LAB — CERVICOVAGINAL ANCILLARY ONLY
Chlamydia: NEGATIVE
Comment: NEGATIVE
Comment: NEGATIVE
Comment: NORMAL
Neisseria Gonorrhea: NEGATIVE
Trichomonas: NEGATIVE

## 2020-10-15 LAB — OBSTETRIC PANEL, INCLUDING HIV
Antibody Screen: NEGATIVE
Basophils Absolute: 0 10*3/uL (ref 0.0–0.2)
Basos: 0 %
EOS (ABSOLUTE): 0.1 10*3/uL (ref 0.0–0.4)
Eos: 1 %
HIV Screen 4th Generation wRfx: NONREACTIVE
Hematocrit: 37.1 % (ref 34.0–46.6)
Hemoglobin: 12.4 g/dL (ref 11.1–15.9)
Hepatitis B Surface Ag: NEGATIVE
Immature Grans (Abs): 0.1 10*3/uL (ref 0.0–0.1)
Immature Granulocytes: 1 %
Lymphocytes Absolute: 2 10*3/uL (ref 0.7–3.1)
Lymphs: 18 %
MCH: 29.9 pg (ref 26.6–33.0)
MCHC: 33.4 g/dL (ref 31.5–35.7)
MCV: 89 fL (ref 79–97)
Monocytes Absolute: 0.6 10*3/uL (ref 0.1–0.9)
Monocytes: 5 %
Neutrophils Absolute: 8.3 10*3/uL — ABNORMAL HIGH (ref 1.4–7.0)
Neutrophils: 75 %
Platelets: 163 10*3/uL (ref 150–450)
RBC: 4.15 x10E6/uL (ref 3.77–5.28)
RDW: 12 % (ref 11.7–15.4)
RPR Ser Ql: NONREACTIVE
Rh Factor: POSITIVE
Rubella Antibodies, IGG: 1.96 index (ref >0.99)
WBC: 11.1 10*3/uL — ABNORMAL HIGH (ref 3.4–10.8)

## 2020-10-15 LAB — HCV INTERPRETATION

## 2020-10-15 LAB — HCV AB W REFLEX TO QUANT PCR: HCV Ab: <0.1 s/co ratio (ref 0.0–0.9)

## 2020-10-25 ENCOUNTER — Other Ambulatory Visit: Payer: Self-pay

## 2020-10-25 ENCOUNTER — Ambulatory Visit
Admission: RE | Admit: 2020-10-25 | Discharge: 2020-10-25 | Disposition: A | Discharge status: Home / Self Care | Payer: Medicaid Other | Source: Ambulatory Visit | Attending: Family Medicine | Admitting: Family Medicine

## 2020-10-25 ENCOUNTER — Other Ambulatory Visit: Payer: Self-pay | Admitting: Family Medicine

## 2020-10-25 DIAGNOSIS — Z3480 Encounter for supervision of other normal pregnancy, unspecified trimester: Secondary | ICD-10-CM

## 2020-10-25 DIAGNOSIS — O26841 Uterine size-date discrepancy, first trimester: Secondary | ICD-10-CM | POA: Diagnosis not present

## 2020-10-25 DIAGNOSIS — Z3A13 13 weeks gestation of pregnancy: Secondary | ICD-10-CM | POA: Diagnosis not present

## 2020-11-15 ENCOUNTER — Other Ambulatory Visit: Payer: Self-pay

## 2020-11-15 ENCOUNTER — Ambulatory Visit (INDEPENDENT_AMBULATORY_CARE_PROVIDER_SITE_OTHER): Payer: Medicaid Other | Admitting: Family Medicine

## 2020-11-15 VITALS — BP 96/60 | HR 87 | Wt 160.5 lb

## 2020-11-15 DIAGNOSIS — Z3480 Encounter for supervision of other normal pregnancy, unspecified trimester: Secondary | ICD-10-CM

## 2020-11-15 DIAGNOSIS — Z3492 Encounter for supervision of normal pregnancy, unspecified, second trimester: Secondary | ICD-10-CM | POA: Insufficient documentation

## 2020-11-15 DIAGNOSIS — Z3482 Encounter for supervision of other normal pregnancy, second trimester: Secondary | ICD-10-CM

## 2020-11-15 MED ORDER — ASPIRIN EC 81 MG PO TBEC: 81.0000 mg | DELAYED_RELEASE_TABLET | Freq: Every day | ORAL | 11 refills | Status: DC

## 2020-11-15 NOTE — Assessment & Plan Note (Signed)
  Nursing Staff Provider  Office Location  [facility]  Dating   by 13w [place]  Language  English Anatomy US    Flu Vaccine   Genetic Screen  NIPS:   AFP:   First Screen:  Quad:    TDaP vaccine    Hgb A1C or  GTT Early WNL Third trimester   Rhogam  n/a   LAB RESULTS   Feeding Plan Breast Blood Type AB/Positive/-- (05/20 1540)   Contraception Depo Antibody Negative (05/20 1540)  Circumcision  Rubella 1.96 (05/20 1540)  Pediatrician  [facility] RPR Non Reactive (05/20 1540)   Support Person  HBsAg Negative (05/20 1540)   Prenatal Classes offered HIV Non Reactive (05/20 1540)  BTL Consent n/a GBS  (For PCN allergy, check sensitivities)   VBAC Consent n/a Pap  NILM 10/2019  Resident PCP  [facility] Hgb Electro   WNL  COVID Vax  none CF   Hep C Antibody  neg SMA     Waterbirth  Refer to [facility] -> [ ]  Class [ ]  Consent [ ]  CNM visit

## 2020-11-15 NOTE — Progress Notes (Signed)
  Patient Name: Lisa Woods Date of Birth: 03/14/1991 [facility] Prenatal Visit  Lisa Woods is a 30 y.o. G2P1001 at Unknown here for routine follow up. She is dated by early ultrasound.  She reports no complaints.  She denies vaginal bleeding.  See flow sheet for details.  Vitals:   11/15/20 1546  BP: 96/60  Pulse: 87     A/P: Pregnancy at Unknown.  Doing well.    Routine Prenatal Care:  Dating reviewed, dating tab is correct. Patient dated by [place] at 13w, EDD 05/01/21. Fetal heart tones Appropriate Influenza vaccine not administered as not influenza season.   COVID vaccination was discussed and patient is interested in doing it, but not yet; will continue to discuss.  The patient has the following indication for screening preexisting diabetes: BMI > 25 and high risk ethnicity (Latino, [place] American, Native American, Mal[place] Islander, Asian Naval architect) . She had 1 hr GTT on 10/12/20 which was WNL. Will need 3 hr GTT at 28 weeks.  Anatomy ultrasound ordered to be scheduled at 18-20 weeks. Patient is interested in genetic screening. As she is past 13 weeks and 6 days, a NIPS (panorma, cell free DNA) was performed.  Pregnancy education including expected weight gain in pregnancy, OTC medication use, continued use of prenatal vitamin, smoking cessation if applicable, and nutrition in pregnancy.   Bleeding and pain precautions reviewed.  2. Pregnancy issues include the following and were addressed as appropriate today:  Reviewed labs from initial prenatal visit. No need to repeat CBC yet, likely leukocytosis was demargination related to pregnancy. Discussed moderate risk factors for HTN/Pre-E/E; patient would like to use ASA. Prescribed ASA 81 mg qd. Problem list  and pregnancy box updated: Yes.   Follow up 4 weeks.

## 2020-11-15 NOTE — Patient Instructions (Signed)
It was a pleasure to see you today!  Go to the MAU at [facility] at [facility] if: You have cramping/contractions that do not go away with drinking water Your water breaks.  Sometimes it is a big gush of fluid, sometimes it is just a trickle that keeps getting your underwear wet or running down your legs You have vaginal bleeding.    You do not feel your baby moving like normal.  If you do not, get something to eat and drink and lay down and focus on feeling your baby move. If your baby is still not moving like normal, you should go to MAU.  We will get you scheduled for an anatomy ultrasound in 2 weeks. We will let you know the date and time once it is scheduled.  3. For genetic screening, you did cell free DNA testing. This should come back next week. I will let you know the results.  4. Start taking a baby aspirin (81 mg) once a day with your prenatal vitamin.   Be Well,  Dr. Leary Roca

## 2020-11-27 ENCOUNTER — Encounter: Payer: Self-pay | Admitting: Family Medicine

## 2020-12-11 ENCOUNTER — Other Ambulatory Visit: Payer: Self-pay | Admitting: *Deleted

## 2020-12-11 ENCOUNTER — Ambulatory Visit: Payer: Medicaid Other | Admitting: *Deleted

## 2020-12-11 ENCOUNTER — Other Ambulatory Visit: Payer: Self-pay

## 2020-12-11 ENCOUNTER — Ambulatory Visit: Payer: Medicaid Other | Attending: Family Medicine

## 2020-12-11 ENCOUNTER — Encounter: Payer: Self-pay | Admitting: *Deleted

## 2020-12-11 VITALS — BP 120/61 | HR 83

## 2020-12-11 DIAGNOSIS — Z3689 Encounter for other specified antenatal screening: Secondary | ICD-10-CM | POA: Diagnosis not present

## 2020-12-11 DIAGNOSIS — O09299 Supervision of pregnancy with other poor reproductive or obstetric history, unspecified trimester: Secondary | ICD-10-CM

## 2020-12-11 DIAGNOSIS — Z3480 Encounter for supervision of other normal pregnancy, unspecified trimester: Secondary | ICD-10-CM | POA: Insufficient documentation

## 2020-12-12 ENCOUNTER — Encounter: Payer: Medicaid Other | Admitting: Family Medicine

## 2020-12-12 NOTE — Progress Notes (Deleted)
  Patient Name: [name] Date of Birth: Jun 02, 1990 [facility] Prenatal Visit  Lisa Woods is a 30 y.o. G2P1001 at [redacted]w[redacted]d here for routine follow up. She is dated by {Ob dating:14516}.  She reports {symptoms; pregnancy related:14538}.  She denies vaginal bleeding.  See flow sheet for details.  There were no vitals filed for this visit.   A/P: Pregnancy at [redacted]w[redacted]d.  Doing well.    Routine Prenatal Care:  Dating reviewed, dating tab is correct Fetal heart tones {appropriate:23337} Influenza vaccine {given:23340}  COVID vaccination was discussed and ***.  The patient has the following indication for screening preexisting diabetes: BMI > 25 and high risk ethnicity (Latino, [place] American, Native American, Mal[place] Islander, Asian Naval architect) . Anatomy ultrasound completed and reviewed. Patient has completed genetic testing, low risk NIPS  Pregnancy education including expected weight gain in pregnancy, OTC medication use, continued use of prenatal vitamin, smoking cessation if applicable, and nutrition in pregnancy.   Bleeding and pain precautions reviewed.  2. Pregnancy issues include the following and were addressed as appropriate today:  NIPS low risk, anatomy scan found EIC, MFM reports that this can be considered a normal variant in this setting, no further work up needed. Problem list  and pregnancy box updated: {yes/no:20286::"Yes"}.   Follow up 4 weeks.

## 2020-12-14 ENCOUNTER — Encounter: Payer: Self-pay | Admitting: Family Medicine

## 2020-12-21 ENCOUNTER — Other Ambulatory Visit: Payer: Self-pay | Admitting: Family Medicine

## 2020-12-21 DIAGNOSIS — Z3480 Encounter for supervision of other normal pregnancy, unspecified trimester: Secondary | ICD-10-CM

## 2020-12-21 MED ORDER — ASPIRIN EC 81 MG PO TBEC: 81.0000 mg | DELAYED_RELEASE_TABLET | Freq: Every day | ORAL | 11 refills | Status: DC

## 2020-12-28 ENCOUNTER — Ambulatory Visit (INDEPENDENT_AMBULATORY_CARE_PROVIDER_SITE_OTHER): Payer: Medicaid Other | Admitting: Family Medicine

## 2020-12-28 ENCOUNTER — Other Ambulatory Visit: Payer: Self-pay | Admitting: Family Medicine

## 2020-12-28 ENCOUNTER — Other Ambulatory Visit: Payer: Self-pay

## 2020-12-28 ENCOUNTER — Other Ambulatory Visit (HOSPITAL_COMMUNITY)
Admission: RE | Admit: 2020-12-28 | Discharge: 2020-12-28 | Disposition: A | Discharge status: Home / Self Care | Payer: Medicaid Other | Source: Ambulatory Visit | Attending: Family Medicine | Admitting: Family Medicine

## 2020-12-28 VITALS — BP 100/60 | HR 92 | Wt 169.0 lb

## 2020-12-28 DIAGNOSIS — B373 Candidiasis of vulva and vagina: Secondary | ICD-10-CM

## 2020-12-28 DIAGNOSIS — N898 Other specified noninflammatory disorders of vagina: Secondary | ICD-10-CM | POA: Insufficient documentation

## 2020-12-28 DIAGNOSIS — Z113 Encounter for screening for infections with a predominantly sexual mode of transmission: Secondary | ICD-10-CM | POA: Insufficient documentation

## 2020-12-28 DIAGNOSIS — B3731 Acute candidiasis of vulva and vagina: Secondary | ICD-10-CM

## 2020-12-28 DIAGNOSIS — Z3482 Encounter for supervision of other normal pregnancy, second trimester: Secondary | ICD-10-CM

## 2020-12-28 LAB — POCT WET PREP (WET MOUNT)
Clue Cells Wet Prep Whiff POC: NEGATIVE
Trichomonas Wet Prep HPF POC: ABSENT

## 2020-12-28 MED ORDER — CLOTRIMAZOLE 2 % VA CREA: 1.0000 | TOPICAL_CREAM | Freq: Every day | VAGINAL | 0 refills | Status: DC

## 2020-12-28 NOTE — Progress Notes (Signed)
  [facility] Prenatal Visit  Lisa Woods is a 30 y.o. G2P1001 at [redacted]w[redacted]d here for routine follow up. She is dated by early ultrasound.  She reports vaginal irritation. She thinks she has a yeast infection as she has had about 5 days of white, thick discharge and vaginal itching. She tried OTC monistat without improvement. She reports good fetal movement. No bleeding, loss of fluid, contractions. See flow sheet for details.  Vitals:   12/28/20 1110  BP: 100/60  Pulse: 92   A/P: Pregnancy at [redacted]w[redacted]d.  Doing well.   Dating reviewed, dating tab is correct Fetal heart tones Appropriate Fundal height within expected range.  Anatomy ultrasound reviewed and notable for EIF. MFM remarked that in context of LR NIPS, this finding is considered a normal variant. Otherwise anatomy WNL.  Influenza vaccine not administered as not influenza season. Marland Kitchen  COVID vaccination was discussed and patient declined today, will continue to consider it.  Indications for screening for preexisting diabetes include: BMI > 25 and high risk ethnicity (Latino, [place] American, Native American, Mal[place] Islander, Asian Naval architect) .  Pregnancy education provided on the following topics: fetal growth and movement, ultrasound assessment, and upcoming laboratory assessment.   Scheduled for [facility] during second trimester on 02/14/21 (first available). Preterm labor precautions given.   2. Pregnancy issues include the following and were addressed as appropriate today:   Early 1hr GTT WNL in May, will need retest at 28 weeks.       Vaginal itching and irritation: obtained wet prep that showed vaginal candidiasis, negative for BV and trich. Also obtained GC/CT, repeat ob urine culture. Will treat with clotrimazole 2% vaginal cream x3days, follow up pending results.       Patient is taking daily baby ASA.  Problem list and pregnancy box updated: Yes.   Follow up 4 weeks.

## 2020-12-28 NOTE — Patient Instructions (Addendum)
It was a pleasure to see you today!  We will get some labs today.  If they are abnormal or we need to do something about them, I will call you.  If they are normal, I will send you a message on MyChart (if it is active) or a letter in the mail.  If you don't hear from [place] in 2 weeks, please call the office  [phone]. Go to the MAU at [facility] at [facility] if: You have cramping/contractions that do not go away with drinking water Your water breaks.  Sometimes it is a big gush of fluid, sometimes it is just a trickle that keeps getting your underwear wet or running down your legs You have vaginal bleeding.    You do not feel your baby moving like normal.  If you do not, get something to eat and drink and lay down and focus on feeling your baby move. If your baby is still not moving like normal, you should go to MAU. Follow up on     Be Well,  Dr. Leary Roca

## 2020-12-30 LAB — CULTURE, OB URINE

## 2020-12-30 LAB — URINE CULTURE, OB REFLEX

## 2020-12-31 LAB — CERVICOVAGINAL ANCILLARY ONLY
Chlamydia: NEGATIVE
Comment: NEGATIVE
Comment: NORMAL
Neisseria Gonorrhea: NEGATIVE

## 2021-01-01 ENCOUNTER — Telehealth: Payer: Self-pay

## 2021-01-01 NOTE — Telephone Encounter (Signed)
Received call from pharmacy regarding rx for clotrimazole not being covered by patient's insurance.   Pharmacist is requesting alternative, Metrogel 0.75%, as this is covered by patient's insurance.   Received okay from Dr. Leary Roca to provide verbal okay to change. Updated pharmacist.   Veronda Prude, RN

## 2021-01-30 ENCOUNTER — Encounter: Payer: Medicaid Other | Admitting: Family Medicine

## 2021-01-30 NOTE — Progress Notes (Deleted)
Lisa Woods is a 30 y.o. G2P1001 at [redacted]w[redacted]d here for routine follow up. She is dated by {Ob dating:14516}.  She reports {symptoms; pregnancy related:14538}. See flow sheet for details.  A/P: Pregnancy at [redacted]w[redacted]d.  Doing well.   Dating reviewed, dating tab is {correct:23336::"correct"} Fetal heart tones {appropriate:23337} Fundal height {fundal height:23342::"within expected range. "} Pregnancy issues include ***. Problem list updated {yes/no:20286::"Yes"}.   Influenza vaccine {given:23340}  Screening for gestational diabetes {GDMcompleted:23344::"completed today and appropriate. "}. ***even if prior 1 hour test completed, if before 24 weeks, repeat. Screening before 24 weeks is screening for preexisting DM***  Pregnancy education completed including: fetal growth, breastfeeding, contraception, and expected weight gain in pregnancy.   The patient {CWHCS:23409::"does not have a history of Cesarean delivery and no referral to [facility] is indicated"} Scheduled for [facility] during second trimester on ***. Preterm labor, bleeding, and pain precautions given.   Follow up 4 weeks.

## 2021-01-31 ENCOUNTER — Encounter: Payer: Medicaid Other | Admitting: Family Medicine

## 2021-02-05 ENCOUNTER — Ambulatory Visit: Payer: Medicaid Other

## 2021-02-14 ENCOUNTER — Other Ambulatory Visit: Payer: Self-pay

## 2021-02-14 ENCOUNTER — Ambulatory Visit (INDEPENDENT_AMBULATORY_CARE_PROVIDER_SITE_OTHER): Payer: Medicaid Other | Admitting: Family Medicine

## 2021-02-14 VITALS — BP 115/66 | HR 86 | Wt 176.4 lb

## 2021-02-14 DIAGNOSIS — Z23 Encounter for immunization: Secondary | ICD-10-CM | POA: Diagnosis not present

## 2021-02-14 DIAGNOSIS — Z3482 Encounter for supervision of other normal pregnancy, second trimester: Secondary | ICD-10-CM | POA: Diagnosis not present

## 2021-02-14 LAB — POCT 1 HR PRENATAL GLUCOSE: Glucose 1 Hr Prenatal, POC: 74 mg/dL

## 2021-02-14 NOTE — Progress Notes (Signed)
[facility] Visit  Renada Cronin is a 30 y.o. G2P1001 at [redacted]w[redacted]d (via [redacted]w[redacted]d u/s) who presents to [facility] for routine follow up. Prenatal course, history, notes, ultrasounds, and laboratory results reviewed.  Denies cramping/ctx, fluid leaking, vaginal bleeding, or decreased fetal movement. Taking PNV.  Recently treated for yeast, reports symptoms resolved.  Primary Prenatal Care Provider: Dr. Leary Roca  Postpartum Plans: - delivery planning: SVD - circumcision: n/a - feeding: breast - pediatrician: [facility] - contraception: depo  FHR: 140 Uterine size: 30  Assessment & Plan  1. Routine prenatal care: - 28 week labs obtained today: 1hr GTT, CBC, HIV, RPR - encouraged flu vaccine, patient will think about it - encouraged COVID vaccine, patient will think about it - Tdap given today - continue PNV, aspirin for pre-E prophylaxis  Next prenatal visit in 2 weeks. Labor & fetal movement precautions discussed.  Levert Feinstein, MD [facility]

## 2021-02-14 NOTE — Patient Instructions (Addendum)
It was nice to meet you today!  Go to the MAU at [facility] at [facility] if: You have cramping/contractions that do not go away with drinking water Your water breaks.  Sometimes it is a big gush of fluid, sometimes it is just a trickle that keeps getting your underwear wet or running down your legs You have vaginal bleeding.    You do not feel your baby moving like normal.  If you do not, get something to eat and drink and lay down and focus on feeling your baby move. If your baby is still not moving like normal, you should go to MAU.   Tdap vaccine today Think about flu shot - recommend you get this and COVID vaccine Diabetes test and other labwork today Next visit in 2 weeks  Be well, Dr. Pollie Meyer   Third Trimester of Pregnancy The third trimester of pregnancy is from week 28 through week 40. This is months 7 through 9. The third trimester is a time when the unborn baby (fetus) is growing rapidly. At the end of the ninth month, the fetus is about 20 inches long and weighs 6-10 pounds. Body changes during your third trimester During the third trimester, your body will continue to go through many changes. The changes vary and generally return to normal after your baby is born. Physical changes Your weight will continue to increase. You can expect to gain 25-35 pounds (11-16 kg) by the end of the pregnancy if you begin pregnancy at a normal weight. If you are underweight, you can expect to gain 28-40 lb (about 13-18 kg), and if you are overweight, you can expect to gain 15-25 lb (about 7-11 kg). You may begin to get stretch marks on your hips, abdomen, and breasts. Your breasts will continue to grow and may hurt. A yellow fluid (colostrum) may leak from your breasts. This is the first milk you are producing for your baby. You may have changes in your hair. These can include thickening of your hair, rapid growth, and changes in texture. Some people also have hair loss during  or after pregnancy, or hair that feels dry or thin. Your belly button may stick out. You may notice more swelling in your hands, face, or ankles. Health changes You may have heartburn. You may have constipation. You may develop hemorrhoids. You may develop swollen, bulging veins (varicose veins) in your legs. You may have increased body aches in the pelvis, back, or thighs. This is due to weight gain and increased hormones that are relaxing your joints. You may have increased tingling or numbness in your hands, arms, and legs. The skin on your abdomen may also feel numb. You may feel short of breath because of your expanding uterus. Other changes You may urinate more often because the fetus is moving lower into your pelvis and pressing on your bladder. You may have more problems sleeping. This may be caused by the size of your abdomen, an increased need to urinate, and an increase in your body's metabolism. You may notice the fetus "dropping," or moving lower in your abdomen (lightening). You may have increased vaginal discharge. You may notice that you have pain around your pelvic bone as your uterus distends. Follow these instructions at home: Medicines Follow your health care provider's instructions regarding medicine use. Specific medicines may be either safe or unsafe to take during pregnancy. Do not take any medicines unless approved by your health care provider. Take a prenatal vitamin  that contains at least 600 micrograms (mcg) of folic acid. Eating and drinking Eat a healthy diet that includes fresh fruits and vegetables, whole grains, good sources of protein such as meat, eggs, or tofu, and low-fat dairy products. Avoid raw meat and unpasteurized juice, milk, and cheese. These carry germs that can harm you and your baby. Eat 4 or 5 small meals rather than 3 large meals a day. You may need to take these actions to prevent or treat constipation: Drink enough fluid to keep your urine  pale yellow. Eat foods that are high in fiber, such as beans, whole grains, and fresh fruits and vegetables. Limit foods that are high in fat and processed sugars, such as fried or sweet foods. Activity Exercise only as directed by your health care provider. Most people can continue their usual exercise routine during pregnancy. Try to exercise for 30 minutes at least 5 days a week. Stop exercising if you experience contractions in the uterus. Stop exercising if you develop pain or cramping in the lower abdomen or lower back. Avoid heavy lifting. Do not exercise if it is very hot or humid or if you are at a high altitude. If you choose to, you may continue to have sex unless your health care provider tells you not to. Relieving pain and discomfort Take frequent breaks and rest with your legs raised (elevated) if you have leg cramps or low back pain. Take warm sitz baths to soothe any pain or discomfort caused by hemorrhoids. Use hemorrhoid cream if your health care provider approves. Wear a supportive bra to prevent discomfort from breast tenderness. If you develop varicose veins: Wear support hose as told by your health care provider. Elevate your feet for 15 minutes, 3-4 times a day. Limit salt in your diet. Safety Talk to your health care provider before traveling far distances. Do not use hot tubs, steam rooms, or saunas. Wear your seat belt at all times when driving or riding in a car. Talk with your health care provider if someone is verbally or physically abusive to you. Preparing for birth To prepare for the arrival of your baby: Take prenatal classes to understand, practice, and ask questions about labor and delivery. Visit the hospital and tour the maternity area. Purchase a rear-facing car seat and make sure you know how to install it in your car. Prepare the baby's room or sleeping area. Make sure to remove all pillows and stuffed animals from the baby's crib to prevent  suffocation. General instructions Avoid cat litter boxes and soil used by cats. These carry germs that can cause birth defects in the baby. If you have a cat, ask someone to clean the litter box for you. Do not douche or use tampons. Do not use scented sanitary pads. Do not use any products that contain nicotine or tobacco, such as cigarettes, e-cigarettes, and chewing tobacco. If you need help quitting, ask your health care provider. Do not use any herbal remedies, illegal drugs, or medicines that were not prescribed to you. Chemicals in these products can harm your baby. Do not drink alcohol. You will have more frequent prenatal exams during the third trimester. During a routine prenatal visit, your health care provider will do a physical exam, perform tests, and discuss your overall health. Keep all follow-up visits. This is important. Where to find more information American Pregnancy Association: americanpregnancy.org [employer]ologists: https://www.todd-brady.net/ Office on [employer] Health: [employer].nz Contact a health care provider if you have:  A fever. Mild pelvic cramps, pelvic pressure, or nagging pain in your abdominal area or lower back. Vomiting or diarrhea. Bad-smelling vaginal discharge or foul-smelling urine. Pain when you urinate. A headache that does not go away when you take medicine. Visual changes or see spots in front of your eyes. Get help right away if: Your water breaks. You have regular contractions less than 5 minutes apart. You have spotting or bleeding from your vagina. You have severe abdominal pain. You have difficulty breathing. You have chest pain. You have fainting spells. You have not felt your baby move for the time period told by your health care provider. You have new or increased pain, swelling, or redness in an arm or leg. Summary The third trimester of pregnancy is from week 28 through week  40 (months 7 through 9). You may have more problems sleeping. This can be caused by the size of your abdomen, an increased need to urinate, and an increase in your body's metabolism. You will have more frequent prenatal exams during the third trimester. Keep all follow-up visits. This is important. This information is not intended to replace advice given to you by your health care provider. Make sure you discuss any questions you have with your health care provider. Document Revised: 10/19/2019 Document Reviewed: 08/25/2019 Elsevier Patient Education  2022 [employer].

## 2021-02-15 LAB — RPR: RPR Ser Ql: NONREACTIVE

## 2021-02-15 LAB — CBC
Hematocrit: 36.7 % (ref 34.0–46.6)
Hemoglobin: 12 g/dL (ref 11.1–15.9)
MCH: 29.3 pg (ref 26.6–33.0)
MCHC: 32.7 g/dL (ref 31.5–35.7)
MCV: 90 fL (ref 79–97)
Platelets: 163 10*3/uL (ref 150–450)
RBC: 4.1 x10E6/uL (ref 3.77–5.28)
RDW: 12.3 % (ref 11.7–15.4)
WBC: 9.2 10*3/uL (ref 3.4–10.8)

## 2021-02-15 LAB — HIV ANTIBODY (ROUTINE TESTING W REFLEX): HIV Screen 4th Generation wRfx: NONREACTIVE

## 2021-02-18 ENCOUNTER — Encounter: Payer: Self-pay | Admitting: *Deleted

## 2021-02-18 ENCOUNTER — Ambulatory Visit: Payer: Medicaid Other | Attending: Obstetrics and Gynecology | Admitting: *Deleted

## 2021-02-18 ENCOUNTER — Ambulatory Visit: Payer: Medicaid Other

## 2021-02-18 ENCOUNTER — Other Ambulatory Visit: Payer: Self-pay

## 2021-02-18 VITALS — BP 129/67 | HR 85

## 2021-02-18 DIAGNOSIS — Z1589 Genetic susceptibility to other disease: Secondary | ICD-10-CM

## 2021-02-28 ENCOUNTER — Ambulatory Visit: Payer: Medicaid Other | Attending: Obstetrics and Gynecology

## 2021-02-28 ENCOUNTER — Other Ambulatory Visit: Payer: Self-pay

## 2021-02-28 ENCOUNTER — Ambulatory Visit: Payer: Medicaid Other | Admitting: *Deleted

## 2021-02-28 ENCOUNTER — Encounter: Payer: Self-pay | Admitting: *Deleted

## 2021-02-28 ENCOUNTER — Other Ambulatory Visit: Payer: Self-pay | Admitting: Obstetrics and Gynecology

## 2021-02-28 VITALS — BP 109/53 | HR 99

## 2021-02-28 DIAGNOSIS — O358XX0 Maternal care for other (suspected) fetal abnormality and damage, not applicable or unspecified: Secondary | ICD-10-CM

## 2021-02-28 DIAGNOSIS — O403XX0 Polyhydramnios, third trimester, not applicable or unspecified: Secondary | ICD-10-CM

## 2021-02-28 DIAGNOSIS — O283 Abnormal ultrasonic finding on antenatal screening of mother: Secondary | ICD-10-CM | POA: Diagnosis not present

## 2021-02-28 DIAGNOSIS — Z3A31 31 weeks gestation of pregnancy: Secondary | ICD-10-CM

## 2021-02-28 DIAGNOSIS — O09293 Supervision of pregnancy with other poor reproductive or obstetric history, third trimester: Secondary | ICD-10-CM | POA: Diagnosis not present

## 2021-02-28 DIAGNOSIS — O09299 Supervision of pregnancy with other poor reproductive or obstetric history, unspecified trimester: Secondary | ICD-10-CM | POA: Insufficient documentation

## 2021-03-01 ENCOUNTER — Other Ambulatory Visit: Payer: Self-pay | Admitting: *Deleted

## 2021-03-01 DIAGNOSIS — O403XX0 Polyhydramnios, third trimester, not applicable or unspecified: Secondary | ICD-10-CM

## 2021-03-28 ENCOUNTER — Encounter: Payer: Self-pay | Admitting: *Deleted

## 2021-03-28 ENCOUNTER — Other Ambulatory Visit: Payer: Self-pay

## 2021-03-28 ENCOUNTER — Ambulatory Visit: Payer: Medicaid Other | Attending: Obstetrics and Gynecology

## 2021-03-28 ENCOUNTER — Ambulatory Visit: Payer: Medicaid Other | Admitting: *Deleted

## 2021-03-28 VITALS — BP 117/66 | HR 90

## 2021-03-28 DIAGNOSIS — Z3A35 35 weeks gestation of pregnancy: Secondary | ICD-10-CM | POA: Diagnosis not present

## 2021-03-28 DIAGNOSIS — O403XX0 Polyhydramnios, third trimester, not applicable or unspecified: Secondary | ICD-10-CM | POA: Insufficient documentation

## 2021-03-28 DIAGNOSIS — O358XX0 Maternal care for other (suspected) fetal abnormality and damage, not applicable or unspecified: Secondary | ICD-10-CM | POA: Diagnosis not present

## 2021-04-30 ENCOUNTER — Encounter: Payer: Self-pay | Admitting: Family Medicine

## 2021-05-01 ENCOUNTER — Encounter (HOSPITAL_COMMUNITY): Payer: Self-pay | Admitting: *Deleted

## 2021-05-01 ENCOUNTER — Inpatient Hospital Stay (HOSPITAL_COMMUNITY)
Admission: EM | Admit: 2021-05-01 | Discharge: 2021-05-03 | DRG: 805 | Disposition: A | Discharge status: Home / Self Care | Payer: Medicaid Other | Attending: Obstetrics & Gynecology | Admitting: Obstetrics & Gynecology

## 2021-05-01 ENCOUNTER — Inpatient Hospital Stay (HOSPITAL_COMMUNITY): Payer: Medicaid Other | Admitting: Anesthesiology

## 2021-05-01 ENCOUNTER — Other Ambulatory Visit: Payer: Self-pay

## 2021-05-01 DIAGNOSIS — Z20822 Contact with and (suspected) exposure to covid-19: Secondary | ICD-10-CM | POA: Diagnosis not present

## 2021-05-01 DIAGNOSIS — D696 Thrombocytopenia, unspecified: Secondary | ICD-10-CM | POA: Diagnosis not present

## 2021-05-01 DIAGNOSIS — O9912 Other diseases of the blood and blood-forming organs and certain disorders involving the immune mechanism complicating childbirth: Secondary | ICD-10-CM | POA: Diagnosis not present

## 2021-05-01 DIAGNOSIS — O1404 Mild to moderate pre-eclampsia, complicating childbirth: Principal | ICD-10-CM | POA: Diagnosis present

## 2021-05-01 DIAGNOSIS — Z87891 Personal history of nicotine dependence: Secondary | ICD-10-CM

## 2021-05-01 DIAGNOSIS — O134 Gestational [pregnancy-induced] hypertension without significant proteinuria, complicating childbirth: Secondary | ICD-10-CM | POA: Diagnosis not present

## 2021-05-01 DIAGNOSIS — Z7982 Long term (current) use of aspirin: Secondary | ICD-10-CM

## 2021-05-01 DIAGNOSIS — O133 Gestational [pregnancy-induced] hypertension without significant proteinuria, third trimester: Secondary | ICD-10-CM

## 2021-05-01 DIAGNOSIS — Z3A40 40 weeks gestation of pregnancy: Secondary | ICD-10-CM | POA: Diagnosis not present

## 2021-05-01 DIAGNOSIS — O26893 Other specified pregnancy related conditions, third trimester: Secondary | ICD-10-CM | POA: Diagnosis not present

## 2021-05-01 DIAGNOSIS — Z3492 Encounter for supervision of normal pregnancy, unspecified, second trimester: Secondary | ICD-10-CM

## 2021-05-01 DIAGNOSIS — O139 Gestational [pregnancy-induced] hypertension without significant proteinuria, unspecified trimester: Secondary | ICD-10-CM | POA: Diagnosis present

## 2021-05-01 DIAGNOSIS — O411230 Chorioamnionitis, third trimester, not applicable or unspecified: Secondary | ICD-10-CM | POA: Diagnosis present

## 2021-05-01 DIAGNOSIS — Z8759 Personal history of other complications of pregnancy, childbirth and the puerperium: Secondary | ICD-10-CM | POA: Diagnosis present

## 2021-05-01 DIAGNOSIS — O479 False labor, unspecified: Secondary | ICD-10-CM

## 2021-05-01 DIAGNOSIS — O411290 Chorioamnionitis, unspecified trimester, not applicable or unspecified: Secondary | ICD-10-CM | POA: Diagnosis present

## 2021-05-01 LAB — CBC WITH DIFFERENTIAL/PLATELET
Abs Immature Granulocytes: 0.08 10*3/uL — ABNORMAL HIGH (ref 0.00–0.07)
Basophils Absolute: 0 10*3/uL (ref 0.0–0.1)
Basophils Relative: 0 %
Eosinophils Absolute: 0.1 10*3/uL (ref 0.0–0.5)
Eosinophils Relative: 1 %
HCT: 40.3 % (ref 36.0–46.0)
Hemoglobin: 13.1 g/dL (ref 12.0–15.0)
Immature Granulocytes: 1 %
Lymphocytes Relative: 26 %
Lymphs Abs: 2.6 10*3/uL (ref 0.7–4.0)
MCH: 29 pg (ref 26.0–34.0)
MCHC: 32.5 g/dL (ref 30.0–36.0)
MCV: 89.4 fL (ref 80.0–100.0)
Monocytes Absolute: 0.7 10*3/uL (ref 0.1–1.0)
Monocytes Relative: 8 %
Neutro Abs: 6.3 10*3/uL (ref 1.7–7.7)
Neutrophils Relative %: 64 %
Platelets: 162 10*3/uL (ref 150–400)
RBC: 4.51 MIL/uL (ref 3.87–5.11)
RDW: 13.1 % (ref 11.5–15.5)
WBC: 9.8 10*3/uL (ref 4.0–10.5)
nRBC: 0 % (ref 0.0–0.2)

## 2021-05-01 LAB — COMPREHENSIVE METABOLIC PANEL
ALT: 14 U/L (ref 0–44)
AST: 21 U/L (ref 15–41)
Albumin: 3.3 g/dL — ABNORMAL LOW (ref 3.5–5.0)
Alkaline Phosphatase: 148 U/L — ABNORMAL HIGH (ref 38–126)
Anion gap: 11 (ref 5–15)
BUN: 8 mg/dL (ref 6–20)
CO2: 24 mmol/L (ref 22–32)
Calcium: 9.3 mg/dL (ref 8.9–10.3)
Chloride: 101 mmol/L (ref 98–111)
Creatinine, Ser: 0.81 mg/dL (ref 0.44–1.00)
GFR, Estimated: >60 mL/min (ref >60)
Glucose, Bld: 88 mg/dL (ref 70–99)
Potassium: 3.5 mmol/L (ref 3.5–5.1)
Sodium: 136 mmol/L (ref 135–145)
Total Bilirubin: 0.6 mg/dL (ref 0.3–1.2)
Total Protein: 7.4 g/dL (ref 6.5–8.1)

## 2021-05-01 LAB — TYPE AND SCREEN
ABO/RH(D): AB POS
Antibody Screen: NEGATIVE

## 2021-05-01 LAB — PROTEIN / CREATININE RATIO, URINE
Creatinine, Urine: 83.81 mg/dL
Protein Creatinine Ratio: 0.33 mg/mg{Cre} — ABNORMAL HIGH (ref 0.00–0.15)
Total Protein, Urine: 28 mg/dL

## 2021-05-01 LAB — RESP PANEL BY RT-PCR (FLU A&B, COVID) ARPGX2
Influenza A by PCR: NEGATIVE
Influenza B by PCR: NEGATIVE
SARS Coronavirus 2 by RT PCR: NEGATIVE

## 2021-05-01 LAB — CBC
HCT: 36 % (ref 36.0–46.0)
Hemoglobin: 12 g/dL (ref 12.0–15.0)
MCH: 29 pg (ref 26.0–34.0)
MCHC: 33.3 g/dL (ref 30.0–36.0)
MCV: 87 fL (ref 80.0–100.0)
Platelets: 154 10*3/uL (ref 150–400)
RBC: 4.14 MIL/uL (ref 3.87–5.11)
RDW: 13 % (ref 11.5–15.5)
WBC: 9.7 10*3/uL (ref 4.0–10.5)
nRBC: 0 % (ref 0.0–0.2)

## 2021-05-01 LAB — GROUP B STREP BY PCR: Group B strep by PCR: NEGATIVE

## 2021-05-01 MED ORDER — FENTANYL-BUPIVACAINE-NACL 0.5-0.125-0.9 MG/250ML-% EP SOLN
12.0000 mL/h | EPIDURAL | Status: DC | PRN
  Administered 2021-05-01: 12 mL/h via EPIDURAL
  Filled 2021-05-01 (×2): qty 250

## 2021-05-01 MED ORDER — PHENYLEPHRINE 40 MCG/ML (10ML) SYRINGE FOR IV PUSH (FOR BLOOD PRESSURE SUPPORT): 80.0000 ug | PREFILLED_SYRINGE | INTRAVENOUS | Status: DC | PRN

## 2021-05-01 MED ORDER — OXYCODONE-ACETAMINOPHEN 5-325 MG PO TABS: 1.0000 | ORAL_TABLET | ORAL | Status: DC | PRN

## 2021-05-01 MED ORDER — ONDANSETRON HCL 4 MG/2ML IJ SOLN
4.0000 mg | Freq: Four times a day (QID) | INTRAMUSCULAR | Status: DC | PRN
  Administered 2021-05-01 (×2): 4 mg via INTRAVENOUS
  Filled 2021-05-01 (×2): qty 2

## 2021-05-01 MED ORDER — LACTATED RINGERS IV BOLUS
1000.0000 mL | Freq: Once | INTRAVENOUS | Status: AC
  Administered 2021-05-01: 1000 mL via INTRAVENOUS

## 2021-05-01 MED ORDER — OXYTOCIN-SODIUM CHLORIDE 30-0.9 UT/500ML-% IV SOLN: 2.5000 [IU]/h | INTRAVENOUS | Status: DC

## 2021-05-01 MED ORDER — LACTATED RINGERS IV SOLN
500.0000 mL | INTRAVENOUS | Status: DC | PRN
  Administered 2021-05-01: 500 mL via INTRAVENOUS

## 2021-05-01 MED ORDER — ACETAMINOPHEN 500 MG PO TABS
1000.0000 mg | ORAL_TABLET | ORAL | Status: DC | PRN
  Administered 2021-05-02 (×2): 1000 mg via ORAL
  Filled 2021-05-01 (×2): qty 2

## 2021-05-01 MED ORDER — EPHEDRINE 5 MG/ML INJ: 10.0000 mg | INTRAVENOUS | Status: DC | PRN

## 2021-05-01 MED ORDER — LIDOCAINE HCL (PF) 1 % IJ SOLN
INTRAMUSCULAR | Status: DC | PRN
  Administered 2021-05-01: 8 mL via EPIDURAL

## 2021-05-01 MED ORDER — TERBUTALINE SULFATE 1 MG/ML IJ SOLN: 0.2500 mg | Freq: Once | INTRAMUSCULAR | Status: DC | PRN

## 2021-05-01 MED ORDER — FENTANYL CITRATE (PF) 100 MCG/2ML IJ SOLN
100.0000 ug | INTRAMUSCULAR | Status: DC | PRN
  Administered 2021-05-01: 100 ug via INTRAVENOUS
  Filled 2021-05-01: qty 2

## 2021-05-01 MED ORDER — LIDOCAINE HCL (PF) 1 % IJ SOLN: 30.0000 mL | INTRAMUSCULAR | Status: DC | PRN

## 2021-05-01 MED ORDER — OXYTOCIN-SODIUM CHLORIDE 30-0.9 UT/500ML-% IV SOLN
1.0000 m[IU]/min | INTRAVENOUS | Status: DC
  Administered 2021-05-01 – 2021-05-02 (×2): 2 m[IU]/min via INTRAVENOUS
  Filled 2021-05-01: qty 500

## 2021-05-01 MED ORDER — SODIUM CHLORIDE 0.9 % IV SOLN
2.0000 g | Freq: Four times a day (QID) | INTRAVENOUS | Status: DC
Start: 2021-05-02 — End: 2021-05-02
  Administered 2021-05-02 (×2): 2 g via INTRAVENOUS
  Filled 2021-05-01 (×2): qty 2000

## 2021-05-01 MED ORDER — SOD CITRATE-CITRIC ACID 500-334 MG/5ML PO SOLN: 30.0000 mL | ORAL | Status: DC | PRN

## 2021-05-01 MED ORDER — ACETAMINOPHEN 325 MG PO TABS: 650.0000 mg | ORAL_TABLET | ORAL | Status: DC | PRN

## 2021-05-01 MED ORDER — LACTATED RINGERS IV SOLN: INTRAVENOUS | Status: DC

## 2021-05-01 MED ORDER — LACTATED RINGERS IV SOLN: 500.0000 mL | Freq: Once | INTRAVENOUS | Status: DC

## 2021-05-01 MED ORDER — FLEET ENEMA 7-19 GM/118ML RE ENEM: 1.0000 | ENEMA | RECTAL | Status: DC | PRN

## 2021-05-01 MED ORDER — DIPHENHYDRAMINE HCL 50 MG/ML IJ SOLN: 12.5000 mg | INTRAMUSCULAR | Status: DC | PRN

## 2021-05-01 MED ORDER — OXYTOCIN BOLUS FROM INFUSION
333.0000 mL | Freq: Once | INTRAVENOUS | Status: AC
  Administered 2021-05-02: 333 mL via INTRAVENOUS

## 2021-05-01 MED ORDER — OXYCODONE-ACETAMINOPHEN 5-325 MG PO TABS: 2.0000 | ORAL_TABLET | ORAL | Status: DC | PRN

## 2021-05-01 NOTE — Progress Notes (Signed)
Pt informed that the ultrasound is considered a limited OB ultrasound and is not intended to be a complete ultrasound exam.  Patient also informed that the ultrasound is not being completed with the intent of assessing for fetal or placental anomalies or any pelvic abnormalities.  Explained that the purpose of today's ultrasound is to assess for  presentation.  Patient acknowledges the purpose of the exam and the limitations of the study.     Vertex presentation confirmed.   Rolm Bookbinder, CNM 05/01/21 10:22 AM

## 2021-05-01 NOTE — Progress Notes (Signed)
Labor Progress Note Reylene Stauder is a 30 y.o. G2P1001 at [redacted]w[redacted]d presented for early labor.   S: Doing well, now comfortable s/p epidural.   O:  BP (!) 143/101   Pulse 90   Temp 98.4 F (36.9 C) (Oral)   Resp 18   Ht 5\' 5"  (1.651 m)   Wt 80 kg   LMP  (LMP Unknown)   SpO2 99%   BMI 29.35 kg/m  EFM: 135/mod/15x15/none  CVE: Dilation: 4 Effacement (%): 90 Station: -2 Presentation: Vertex Exam by:: Dr. [email or web address]   A&P: 30 y.o. G2P1001 [redacted]w[redacted]d  #Labor: Minimal progression since last check with contractions spaced out from earlier. After verbal consent, performed AROM with small/moderate amount of light mec fluid retrieved. Head well applied to cervix following procedure.  #Pain: Epidural  #FWB: Cat I #GBS PCR negative without any additional risk factors at term, no abx     [redacted]w[redacted]d, DO 3:35 PM

## 2021-05-01 NOTE — Progress Notes (Signed)
Labor Progress Note Lisa Woods is a 30 y.o. G2P1001 at [redacted]w[redacted]d presented for early labor.   S: Doing well, now comfortable with epidural. No feeling pressure or ctx.   O:  BP 119/78   Pulse 79   Temp 98.1 F (36.7 C) (Oral)   Resp 18   Ht 5\' 5"  (1.651 m)   Wt 80 kg   LMP  (LMP Unknown)   SpO2 99%   BMI 29.35 kg/m  EFM: 135/mod/15x15/none  Dilation: 6 Effacement (%): 80 Station: -2 Presentation: Vertex Exam by:: C   A&P: 30 y.o. G2P1001 [redacted]w[redacted]d  #Labor: On 2 of pitocin. Made change from 5-6 cm. Placed IUPC to ensure adequate MVU. Continue to titrate pit to complete.  #Pain: Epidural  #FWB: Cat I #GBS PCR negative without any additional risk factors at term, no abx   [redacted]w[redacted]d, MD PGY-2 9:49 PM

## 2021-05-01 NOTE — Progress Notes (Signed)
Lisa Woods is a 30 y.o. G2P1001 at [redacted]w[redacted]d   admitted for spontaneous onset of labor .  Subjective: Feels well. Denies any abdominal or pelvic pressure.  Objective: BP 133/60   Pulse 72   Temp 98.2 F (36.8 C) (Oral)   Resp 16   Ht 5\' 5"  (1.651 m)   Wt 80 kg   LMP  (LMP Unknown)   SpO2 99%   BMI 29.35 kg/m  I/O last 3 completed shifts: In: 1446.8 [I.V.:1446.8] Out: 400 [Urine:400] No intake/output data recorded.  FHT:  FHR: 150 bpm, variability: moderate,  accelerations:  Present,  decelerations:  Present had a round of late decelerations, resolved with fluid bolus and position changes, and turning pit off UC:   regular, every 1-3 minutes SVE:   Dilation: 6 Effacement (%): 80 Station: -2 Exam by:: Dr. [email or web address]  Labs: Lab Results  Component Value Date   WBC 9.7 05/01/2021   HGB 12.0 05/01/2021   HCT 36.0 05/01/2021   MCV 87.0 05/01/2021   PLT 154 05/01/2021    Assessment / Plan: G2P1001 at [redacted]w[redacted]d  admitted for spontaneous onset of labor .  Labor:  making change but minimal (went from 5 to 6 cm with two different examiners) Pitocin was at 2. Due to recurrent late decelerations pitocin turned off. Position changed and now decelerations resolved and with moderate variability.  gHTN:   vitals normal to mild range. Asymptomatic. Fetal Wellbeing:  Category II initially, after resuscitation as described above now Cat I with moderate variability and no signs of fetal acidemia. Pain Control:  Epidural I/D:   GBS neg   [redacted]w[redacted]d 05/01/2021, 11:04 PM

## 2021-05-01 NOTE — ED Triage Notes (Signed)
Patient arrived in active labor. 40wks due day today. Contractions 4-5 min apart. [facility] nurse paged and arrived on site.   Takes baby Aspirin .

## 2021-05-01 NOTE — ED Provider Notes (Signed)
[facility]-EMERGENCY DEPT Provider Note   CSN: [id number] Arrival date & time: 05/01/21  0830     History No chief complaint on file.   Lisa Woods is a 30 y.o. female.  HPI 30 year old female G2 P1 at 40 weeks presents with contractions.  She lost her mucous plug last night and then this morning started getting contractions.  At first they were about an hour apart and then after dropping off her daughter they were 7 minutes apart so she came here.  Most recently they are about 5 minutes apart.  She otherwise denies any eye discomfort or recent illness.  Baby has been moving normally and she has not had vaginal bleeding or gush of fluid.   Past Medical History:  Diagnosis Date   Chlamydia     Patient Active Problem List   Diagnosis Date Noted   Supervision of normal pregnancy in second trimester 11/15/2020   Internal hemorrhoids 11/02/2019   Healthcare maintenance 11/02/2019   Bacterial vaginosis 11/02/2019   Thrombocytopenia (HCC) 02/04/2019   Hidradenitis 01/26/2019   Tobacco use disorder 12/30/2017   Contraception management 03/12/2012    No past surgical history on file.   OB History     Gravida  2   Para  1   Term  1   Preterm      AB      Living  1      SAB      IAB      Ectopic      Multiple      Live Births  1           Family History  Problem Relation Age of Onset   Cancer Mother        Breast Cancer   Heart Problems Father     Social History   Tobacco Use   Smoking status: Former    Packs/day: 0.25    Types: Cigars, Cigarettes    Quit date: 12/04/2020    Years since quitting: 0.4   Smokeless tobacco: Never   Tobacco comments:    only smokes 1 cig a day. Black and mild.  Vaping Use   Vaping Use: Never used  Substance Use Topics   Alcohol use: Not Currently    Comment: occasional , not while preg   Drug use: No    Home Medications Prior to Admission medications   Medication Sig Start Date End  Date Taking? Authorizing Provider  aspirin EC 81 MG tablet Take 1 tablet (81 mg total) by mouth daily. Swallow whole. 12/21/20   Shirlean Mylar, MD  clotrimazole (GYNE-LOTRIMIN 3) 2 % vaginal cream Place 1 Applicatorful vaginally at bedtime. 12/28/20   Shirlean Mylar, MD  Prenatal Vit-Fe Fumarate-FA (PRENATAL MULTIVITAMIN) TABS tablet Take 1 tablet by mouth daily at 12 noon. 10/12/20   Katha Cabal, DO  Prenatal Vit-Fe Fumarate-FA (PRENATAL VITAMINS) 28-0.8 MG TABS Take 1 tablet by mouth daily. 09/20/20   Lamptey, Britta Mccreedy, MD    Allergies    Patient has no known allergies.  Review of Systems   Review of Systems  Constitutional:  Negative for fever.  Gastrointestinal:  Negative for abdominal pain and vomiting.  Genitourinary:  Negative for vaginal bleeding and vaginal discharge.  All other systems reviewed and are negative.  Physical Exam Updated Vital Signs BP 115/85   Pulse 68   Temp 97.9 F (36.6 C) (Oral)   Resp 18   Ht 5\' 5"  (1.651 m)   Wt  80 kg   LMP  (LMP Unknown)   SpO2 100%   BMI 29.35 kg/m   Physical Exam Vitals and nursing note reviewed.  Constitutional:      General: She is not in acute distress.    Appearance: She is well-developed. She is not ill-appearing or diaphoretic.  HENT:     Head: Normocephalic and atraumatic.     Right Ear: External ear normal.     Left Ear: External ear normal.     Nose: Nose normal.  Eyes:     General:        Right eye: No discharge.        Left eye: No discharge.  Cardiovascular:     Rate and Rhythm: Normal rate and regular rhythm.     Heart sounds: Normal heart sounds.  Pulmonary:     Effort: Pulmonary effort is normal.     Breath sounds: Normal breath sounds.  Abdominal:     Palpations: Abdomen is soft.     Tenderness: There is no abdominal tenderness.     Comments: gravid  Skin:    General: Skin is warm and dry.  Neurological:     Mental Status: She is alert.  Psychiatric:        Mood and Affect: Mood is not  anxious.    ED Results / Procedures / Treatments   Labs (all labs ordered are listed, but only abnormal results are displayed) Labs Reviewed  CBC WITH DIFFERENTIAL/PLATELET - Abnormal; Notable for the following components:      Result Value   Abs Immature Granulocytes 0.08 (*)    All other components within normal limits  RESP PANEL BY RT-PCR (FLU A&B, COVID) ARPGX2  COMPREHENSIVE METABOLIC PANEL    EKG None  Radiology No results found.  Procedures Procedures   Medications Ordered in ED Medications  lactated ringers bolus 1,000 mL (has no administration in time range)    ED Course  I have reviewed the triage vital signs and the nursing notes.  Pertinent labs & imaging results that were available during my care of the patient were reviewed by me and considered in my medical decision making (see chart for details).    MDM Rules/Calculators/A&P                           [facility] has seen patient and checked and she is 1 cm dilated and 80% effaced.  Contractions every 4 minutes or so.  She is seemingly not imminently delivering and thus stable for transfer to [facility] to the MAU.  Dr. Shawnie Pons accepts. Final Clinical Impression(s) / ED Diagnoses Final diagnoses:  Uterine contractions during pregnancy    Rx / DC Orders ED Discharge Orders     None        Pricilla Loveless, MD 05/01/21 [phone]

## 2021-05-01 NOTE — Progress Notes (Signed)
[facility] here to transfer patient to MAU.

## 2021-05-01 NOTE — Anesthesia Preprocedure Evaluation (Addendum)
Anesthesia Evaluation  Patient identified by MRN, date of birth, ID band Patient awake    Reviewed: Allergy & Precautions, H&P , Patient's Chart, lab work & pertinent test results  Airway Mallampati: III  TM Distance: >3 FB Neck ROM: full    Dental no notable dental hx. (+) Teeth Intact   Pulmonary neg pulmonary ROS, former smoker,    Pulmonary exam normal breath sounds clear to auscultation       Cardiovascular negative cardio ROS   Rhythm:regular Rate:Normal     Neuro/Psych negative neurological ROS  negative psych ROS   GI/Hepatic negative GI ROS, Neg liver ROS,   Endo/Other  negative endocrine ROS  Renal/GU negative Renal ROS  negative genitourinary   Musculoskeletal   Abdominal Normal abdominal exam  (+)   Peds  Hematology negative hematology ROS (+)   Anesthesia Other Findings   Reproductive/Obstetrics (+) Pregnancy                             Anesthesia Physical  Anesthesia Plan  ASA: 2  Anesthesia Plan: Epidural   Post-op Pain Management:    Induction:   PONV Risk Score and Plan: 2  Airway Management Planned: Natural Airway  Additional Equipment:   Intra-op Plan:   Post-operative Plan:   Informed Consent: I have reviewed the patients History and Physical, chart, labs and discussed the procedure including the risks, benefits and alternatives for the proposed anesthesia with the patient or authorized representative who has indicated his/her understanding and acceptance.       Plan Discussed with: Anesthesiologist  Anesthesia Plan Comments:         Anesthesia Quick Evaluation

## 2021-05-01 NOTE — MAU Note (Signed)
Lisa Woods is a 30 y.o. at [redacted]w[redacted]d here in MAU reporting: arrived via [facility] complaining of contractions that started this morning. States they are every 4-5 minutes. Denies bleeding or LOF. +FM  SVE performed 3.5/80  Onset of complaint: today  Pain score: 7/10  Vitals:   05/01/21 0907 05/01/21 0930  BP:  (!) 138/95  Pulse: 86 74  Resp:  18  Temp:    SpO2: 99% 100%     FHT:+FM, EFM applied in room  Lab orders placed from triage: none

## 2021-05-01 NOTE — H&P (Signed)
OBSTETRIC ADMISSION HISTORY AND PHYSICAL  Lisa Woods is a 31 y.o. female G2P1001 with IUP at [redacted]w[redacted]d by 13 week ultrasound presenting for spontaneous onset of labor. She reports +FMs, No LOF, no VB, no blurry vision, headaches or peripheral edema, and RUQ pain.  She plans on breast feeding. She request depo for birth control. She received her prenatal care at [facility]   Dating: By 13 week ultrasound --->  Estimated Date of Delivery: 05/01/21  Sono:    @[redacted]w[redacted]d , CWD, normal anatomy, cephalic presentation, 2724g, EFW   Prenatal History/Complications: limited [facility]  Past Medical History: Past Medical History:  Diagnosis Date   Chlamydia     Past Surgical History: History reviewed. No pertinent surgical history.  Obstetrical History: OB History     Gravida  2   Para  1   Term  1   Preterm      AB      Living  1      SAB      IAB      Ectopic      Multiple      Live Births  1           Social History Social History   Socioeconomic History   Marital status: Single    Spouse name: Not on file   Number of children: Not on file   Years of education: Not on file   Highest education level: Not on file  Occupational History   Not on file  Tobacco Use   Smoking status: Former    Packs/day: 0.25    Types: Cigars, Cigarettes    Quit date: 12/04/2020    Years since quitting: 0.4   Smokeless tobacco: Never   Tobacco comments:    only smokes 1 cig a day. Black and mild.  Vaping Use   Vaping Use: Never used  Substance and Sexual Activity   Alcohol use: Not Currently    Comment: occasional , not while preg   Drug use: No   Sexual activity: Yes    Birth control/protection: None  Other Topics Concern   Not on file  Social History Narrative   Lives with boyfriend's mother, her boyfriend and their baby. Graduated high school, currently not working. 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   Social Determinants of Health   Financial Resource Strain: Not on file   Food Insecurity: Not on file  Transportation Needs: Not on file  Physical Activity: Not on file  Stress: Not on file  Social Connections: Not on file    Family History: Family History  Problem Relation Age of Onset   Cancer Mother        Breast Cancer   Heart Problems Father     Allergies: No Known Allergies  Medications Prior to Admission  Medication Sig Dispense Refill Last Dose   aspirin EC 81 MG tablet Take 1 tablet (81 mg total) by mouth daily. Swallow whole. 30 tablet 11 05/01/2021   clotrimazole (GYNE-LOTRIMIN 3) 2 % vaginal cream Place 1 Applicatorful vaginally at bedtime. 21 g 0    Prenatal Vit-Fe Fumarate-FA (PRENATAL MULTIVITAMIN) TABS tablet Take 1 tablet by mouth daily at 12 noon. 30 tablet 9    Prenatal Vit-Fe Fumarate-FA (PRENATAL VITAMINS) 28-0.8 MG TABS Take 1 tablet by mouth daily. 90 tablet 2      Review of Systems   All systems reviewed and negative except as stated in HPI  Blood pressure (!) 145/80, pulse 90, temperature 98.3 F (  36.8 C), temperature source Oral, resp. rate 18, height 5\' 5"  (1.651 m), weight 80 kg, SpO2 98 %. General appearance: alert, cooperative, and no distress Lungs: clear to auscultation bilaterally Heart: regular rate and rhythm Abdomen: soft, non-tender; bowel sounds normal Pelvic: n/a Extremities: Homans sign is negative, no sign of DVT DTR's +2 Presentation: cephalic Fetal monitoringBaseline: 135 bpm, Variability: Good {> 6 bpm), Accelerations: Reactive, and Decelerations: Absent Uterine activityFrequency: Every 4-6 minutes Dilation: 3.5 Effacement (%): 80 Station: -2 Exam by:: holly flippin rn   Prenatal labs: ABO, Rh: AB/Positive/-- (05/20 1540) Antibody: Negative (05/20 1540) Rubella: 1.96 (05/20 1540) RPR: Non Reactive (09/22 1245)  HBsAg: Negative (05/20 1540)  HIV: Non Reactive (09/22 1245)  GBS:   pending Prenatal Transfer Tool  Maternal Diabetes: No Genetic Screening: Normal Maternal  Ultrasounds/Referrals: Normal Fetal Ultrasounds or other Referrals:  None Maternal Substance Abuse:  No Significant Maternal Medications:  None Significant Maternal Lab Results: None  Results for orders placed or performed during the hospital encounter of 05/01/21 (from the past 24 hour(s))  Comprehensive metabolic panel   Collection Time: 05/01/21  8:42 AM  Result Value Ref Range   Sodium 136 135 - 145 mmol/L   Potassium 3.5 3.5 - 5.1 mmol/L   Chloride 101 98 - 111 mmol/L   CO2 24 22 - 32 mmol/L   Glucose, Bld 88 70 - 99 mg/dL   BUN 8 6 - 20 mg/dL   Creatinine, Ser 14/07/22 0.44 - 1.00 mg/dL   Calcium 9.3 8.9 - 2.68 mg/dL   Total Protein 7.4 6.5 - 8.1 g/dL   Albumin 3.3 (L) 3.5 - 5.0 g/dL   AST 21 15 - 41 U/L   ALT 14 0 - 44 U/L   Alkaline Phosphatase 148 (H) 38 - 126 U/L   Total Bilirubin 0.6 0.3 - 1.2 mg/dL   GFR, Estimated 34.1 >96 mL/min   Anion gap 11 5 - 15  CBC with Differential   Collection Time: 05/01/21  8:42 AM  Result Value Ref Range   WBC 9.8 4.0 - 10.5 K/uL   RBC 4.51 3.87 - 5.11 MIL/uL   Hemoglobin 13.1 12.0 - 15.0 g/dL   HCT 14/07/22 29.7 - 98.9 %   MCV 89.4 80.0 - 100.0 fL   MCH 29.0 26.0 - 34.0 pg   MCHC 32.5 30.0 - 36.0 g/dL   RDW 21.1 94.1 - 74.0 %   Platelets 162 150 - 400 K/uL   nRBC 0.0 0.0 - 0.2 %   Neutrophils Relative % 64 %   Neutro Abs 6.3 1.7 - 7.7 K/uL   Lymphocytes Relative 26 %   Lymphs Abs 2.6 0.7 - 4.0 K/uL   Monocytes Relative 8 %   Monocytes Absolute 0.7 0.1 - 1.0 K/uL   Eosinophils Relative 1 %   Eosinophils Absolute 0.1 0.0 - 0.5 K/uL   Basophils Relative 0 %   Basophils Absolute 0.0 0.0 - 0.1 K/uL   Immature Granulocytes 1 %   Abs Immature Granulocytes 0.08 (H) 0.00 - 0.07 K/uL    Patient Active Problem List   Diagnosis Date Noted   Normal labor 05/01/2021   Supervision of normal pregnancy in second trimester 11/15/2020   Internal hemorrhoids 11/02/2019   Healthcare maintenance 11/02/2019   Bacterial vaginosis 11/02/2019    Thrombocytopenia (HCC) 02/04/2019   Hidradenitis 01/26/2019   Tobacco use disorder 12/30/2017   Contraception management 03/12/2012    Assessment/Plan:  Lisa Woods is a 30 y.o. G2P1001 at [redacted]w[redacted]d here for  spontaneous onset of labor  #Labor:expectant management for now. New onset HTN, labs pending #Pain: Per patient request #FWB: Cat 1 #ID:  pending #MOF: Breast #MOC: Depo #Circ:  N/a  Rolm Bookbinder, CNM  05/01/2021, 10:50 AM

## 2021-05-01 NOTE — Progress Notes (Addendum)
G2P1 at 40 weeks today reports to [facility] for contractions since 2am this morning.  Currently 2-4 minutes and patient breathing well through.  Receives [facility] with Family Medicine here in [place].  SVE 1/80/-2.  Vertex.  No leaking or bleeding noted.  Dr Shawnie Pons updated on pt status.  Transfer to MAU for further observation and possible admission.  Is taking baby ASA for pre-E.  Being followed for polyhydramnios in third trimester.

## 2021-05-01 NOTE — Anesthesia Procedure Notes (Signed)
Epidural Patient location during procedure: OB Start time: 05/01/2021 11:38 AM End time: 05/01/2021 11:48 AM  Staffing Anesthesiologist: Mellody Dance, MD Performed: anesthesiologist   Preanesthetic Checklist Completed: patient identified, IV checked, site marked, risks and benefits discussed, monitors and equipment checked, pre-op evaluation and timeout performed  Epidural Patient position: sitting Prep: DuraPrep Patient monitoring: heart rate, cardiac monitor, continuous pulse ox and blood pressure Approach: midline Location: L2-L3 Injection technique: LOR saline  Needle:  Needle type: Tuohy  Needle gauge: 17 G Needle length: 9 cm Needle insertion depth: 6 cm Catheter type: closed end flexible Catheter size: 20 Guage Catheter at skin depth: 11 cm Test dose: negative and Other  Assessment Events: blood not aspirated, injection not painful, no injection resistance and negative IV test  Additional Notes Informed consent obtained prior to proceeding including risk of failure, 1% risk of PDPH, risk of minor discomfort and bruising.  Discussed rare but serious complications including epidural abscess, permanent nerve injury, epidural hematoma.  Discussed alternatives to epidural analgesia and patient desires to proceed.  Timeout performed pre-procedure verifying patient name, procedure, and platelet count.  Patient tolerated procedure well.

## 2021-05-02 ENCOUNTER — Encounter (HOSPITAL_COMMUNITY): Payer: Self-pay | Admitting: Family Medicine

## 2021-05-02 DIAGNOSIS — O411290 Chorioamnionitis, unspecified trimester, not applicable or unspecified: Secondary | ICD-10-CM | POA: Diagnosis present

## 2021-05-02 DIAGNOSIS — O9912 Other diseases of the blood and blood-forming organs and certain disorders involving the immune mechanism complicating childbirth: Secondary | ICD-10-CM | POA: Diagnosis not present

## 2021-05-02 DIAGNOSIS — O411230 Chorioamnionitis, third trimester, not applicable or unspecified: Secondary | ICD-10-CM | POA: Diagnosis not present

## 2021-05-02 DIAGNOSIS — O1404 Mild to moderate pre-eclampsia, complicating childbirth: Secondary | ICD-10-CM | POA: Diagnosis not present

## 2021-05-02 DIAGNOSIS — O134 Gestational [pregnancy-induced] hypertension without significant proteinuria, complicating childbirth: Secondary | ICD-10-CM | POA: Diagnosis not present

## 2021-05-02 DIAGNOSIS — Z3A40 40 weeks gestation of pregnancy: Secondary | ICD-10-CM | POA: Diagnosis not present

## 2021-05-02 LAB — CBC
HCT: 33.2 % — ABNORMAL LOW (ref 36.0–46.0)
Hemoglobin: 11 g/dL — ABNORMAL LOW (ref 12.0–15.0)
MCH: 28.6 pg (ref 26.0–34.0)
MCHC: 33.1 g/dL (ref 30.0–36.0)
MCV: 86.2 fL (ref 80.0–100.0)
Platelets: 146 10*3/uL — ABNORMAL LOW (ref 150–400)
RBC: 3.85 MIL/uL — ABNORMAL LOW (ref 3.87–5.11)
RDW: 13.2 % (ref 11.5–15.5)
WBC: 14.4 10*3/uL — ABNORMAL HIGH (ref 4.0–10.5)
nRBC: 0 % (ref 0.0–0.2)

## 2021-05-02 LAB — COMPREHENSIVE METABOLIC PANEL
ALT: 12 U/L (ref 0–44)
AST: 21 U/L (ref 15–41)
Albumin: 2.2 g/dL — ABNORMAL LOW (ref 3.5–5.0)
Alkaline Phosphatase: 122 U/L (ref 38–126)
Anion gap: 10 (ref 5–15)
BUN: 7 mg/dL (ref 6–20)
CO2: 20 mmol/L — ABNORMAL LOW (ref 22–32)
Calcium: 8.2 mg/dL — ABNORMAL LOW (ref 8.9–10.3)
Chloride: 103 mmol/L (ref 98–111)
Creatinine, Ser: 1.38 mg/dL — ABNORMAL HIGH (ref 0.44–1.00)
GFR, Estimated: 53 mL/min — ABNORMAL LOW (ref >60)
Glucose, Bld: 89 mg/dL (ref 70–99)
Potassium: 4.1 mmol/L (ref 3.5–5.1)
Sodium: 133 mmol/L — ABNORMAL LOW (ref 135–145)
Total Bilirubin: 0.6 mg/dL (ref 0.3–1.2)
Total Protein: 5.3 g/dL — ABNORMAL LOW (ref 6.5–8.1)

## 2021-05-02 LAB — RPR: RPR Ser Ql: NONREACTIVE

## 2021-05-02 LAB — GC/CHLAMYDIA PROBE AMP (~~LOC~~) NOT AT ARMC
Chlamydia: NEGATIVE
Comment: NEGATIVE
Comment: NORMAL
Neisseria Gonorrhea: NEGATIVE

## 2021-05-02 MED ORDER — BENZOCAINE-MENTHOL 20-0.5 % EX AERO
1.0000 "application " | INHALATION_SPRAY | CUTANEOUS | Status: DC | PRN
  Administered 2021-05-02: 1 via TOPICAL
  Filled 2021-05-02: qty 56

## 2021-05-02 MED ORDER — SIMETHICONE 80 MG PO CHEW: 80.0000 mg | CHEWABLE_TABLET | ORAL | Status: DC | PRN

## 2021-05-02 MED ORDER — PRENATAL MULTIVITAMIN CH
1.0000 | ORAL_TABLET | Freq: Every day | ORAL | Status: DC
  Administered 2021-05-02 – 2021-05-03 (×2): 1 via ORAL
  Filled 2021-05-02 (×2): qty 1

## 2021-05-02 MED ORDER — IBUPROFEN 600 MG PO TABS
600.0000 mg | ORAL_TABLET | Freq: Four times a day (QID) | ORAL | Status: DC
  Administered 2021-05-02 – 2021-05-03 (×5): 600 mg via ORAL
  Filled 2021-05-02 (×5): qty 1

## 2021-05-02 MED ORDER — ONDANSETRON HCL 4 MG PO TABS: 4.0000 mg | ORAL_TABLET | ORAL | Status: DC | PRN

## 2021-05-02 MED ORDER — COCONUT OIL OIL: 1.0000 "application " | TOPICAL_OIL | Status: DC | PRN

## 2021-05-02 MED ORDER — GENTAMICIN SULFATE 40 MG/ML IJ SOLN
5.0000 mg/kg | INTRAVENOUS | Status: AC
  Administered 2021-05-02: 330 mg via INTRAVENOUS
  Filled 2021-05-02: qty 8.25

## 2021-05-02 MED ORDER — SENNOSIDES-DOCUSATE SODIUM 8.6-50 MG PO TABS
2.0000 | ORAL_TABLET | ORAL | Status: DC
  Administered 2021-05-02 – 2021-05-03 (×2): 2 via ORAL
  Filled 2021-05-02 (×2): qty 2

## 2021-05-02 MED ORDER — DIBUCAINE (PERIANAL) 1 % EX OINT
1.0000 "application " | TOPICAL_OINTMENT | CUTANEOUS | Status: DC | PRN
  Administered 2021-05-03: 1 via RECTAL
  Filled 2021-05-02: qty 28

## 2021-05-02 MED ORDER — ONDANSETRON HCL 4 MG/2ML IJ SOLN: 4.0000 mg | INTRAMUSCULAR | Status: DC | PRN

## 2021-05-02 MED ORDER — WITCH HAZEL-GLYCERIN EX PADS
1.0000 "application " | MEDICATED_PAD | CUTANEOUS | Status: DC | PRN
  Administered 2021-05-02: 1 via TOPICAL

## 2021-05-02 MED ORDER — ACETAMINOPHEN 325 MG PO TABS: 650.0000 mg | ORAL_TABLET | ORAL | Status: DC | PRN

## 2021-05-02 MED ORDER — DIPHENHYDRAMINE HCL 25 MG PO CAPS: 25.0000 mg | ORAL_CAPSULE | Freq: Four times a day (QID) | ORAL | Status: DC | PRN

## 2021-05-02 MED ORDER — MEASLES, MUMPS & RUBELLA VAC IJ SOLR: 0.5000 mL | Freq: Once | INTRAMUSCULAR | Status: DC

## 2021-05-02 MED ORDER — TETANUS-DIPHTH-ACELL PERTUSSIS 5-2.5-18.5 LF-MCG/0.5 IM SUSY: 0.5000 mL | PREFILLED_SYRINGE | Freq: Once | INTRAMUSCULAR | Status: DC

## 2021-05-02 NOTE — Progress Notes (Addendum)
Pharmacy Antibiotic Note  Lisa Woods is a 30 y.o. female admitted on 05/01/2021 with  SOL .  Pharmacy has been consulted for Gentamicin dosing for chorioamnionitis.  Plan: Start Gentamicin 330mg  IV q 24 hr x 1  (5mg /kg)  Height: 5\' 5"  (165.1 cm) Weight: 80 kg (176 lb 6.4 oz) IBW/kg (Calculated) : 57  Temp (24hrs), Avg:98.4 F (36.9 C), Min:97.2 F (36.2 C), Max:100.1 F (37.8 C)  Recent Labs  Lab 05/01/21 0842 05/01/21 1132  WBC 9.8 9.7  CREATININE 0.81  --     Estimated Creatinine Clearance: 107.1 mL/min (by C-G formula based on SCr of 0.81 mg/dL).    No Known Allergies  Antimicrobials this admission: Ampicillin  2g IV Q6 (12/8 >>     Thank you for allowing pharmacy to be a part of this patient's care.  Lisa Woods 05/02/2021 12:11 AM

## 2021-05-02 NOTE — Plan of Care (Signed)
POC discussed with patient. Patient verbalized understanding.

## 2021-05-02 NOTE — Progress Notes (Signed)
Safire Gordin is a 30 y.o. G2P1001 at [redacted]w[redacted]d admitted for SOL  Went to bedside as fetal heart rate now tachycardic in 180s.Cat II with moderate variability. Cervix still without drastic change around 6-6.5 cm.  Moderate mec appreciated on exam as well  Maternal temp 100.1  Clinical picture consistent with Triple I - will give 1g Tylenol - Amp/gent - Fluid bolus - will let antibiotics and tylenol kick in and monitor fetal tracing for signs of acidemia  - Discussed plan with Dr. Charlotta Newton

## 2021-05-02 NOTE — Progress Notes (Signed)
Patient has concerns about amount of bleeding. Patient states that she is filling up her pad within 1 hr. Peripad assessed. Fundal U/1 firm with massage. Bleeding is scant. Patient educated to void every 2-3 hrs. Will reassess bleeding in 1 hr.

## 2021-05-02 NOTE — Anesthesia Postprocedure Evaluation (Signed)
Anesthesia Post Note  Patient: Lisa Woods  Procedure(s) Performed: AN AD HOC LABOR EPIDURAL     Patient location during evaluation: Mother Baby Anesthesia Type: Epidural Level of consciousness: awake and alert and oriented Pain management: satisfactory to patient Vital Signs Assessment: post-procedure vital signs reviewed and stable Respiratory status: respiratory function stable Cardiovascular status: stable Postop Assessment: no headache, no backache, epidural receding, patient able to bend at knees, no signs of nausea or vomiting, adequate PO intake and able to ambulate Anesthetic complications: no   No notable events documented.  Last Vitals:  Vitals:   05/02/21 1035 05/02/21 1420  BP: 134/80 125/75  Pulse: 80 80  Resp: 18 18  Temp: 36.7 C 36.8 C  SpO2:      Last Pain:  Vitals:   05/02/21 1600  TempSrc:   PainSc: 0-No pain   Pain Goal:                   Lisa Woods

## 2021-05-02 NOTE — Lactation Note (Signed)
This note was copied from a baby's chart. Lactation Consultation Note  Patient Name: Lisa Woods SUORV'I Date: 05/02/2021 Reason for consult: L&D Initial assessment Age:29 hours  P2, Baby on warmer being weighed.  Then [employer] assisted with latching.  Baby latched with ease, lips flanged. Lactation to follow up on MBU.   Maternal Data Does the patient have breastfeeding experience prior to this delivery?: Yes How long did the patient breastfeed?:  (only shortly after birth)  Feeding Mother's Current Feeding Choice: Breast Milk and Formula  LATCH Score Latch: Grasps breast easily, tongue down, lips flanged, rhythmical sucking.  Audible Swallowing: A few with stimulation  Type of Nipple: Everted at rest and after stimulation  Comfort (Breast/Nipple): Soft / non-tender  Hold (Positioning): Assistance needed to correctly position infant at breast and maintain latch.  LATCH Score: 8    Interventions Interventions: Assisted with latch;Skin to skin;Education     Consult Status Consult Status: Follow-up from L&D    Dahlia Byes [facility] 05/02/2021, 8:50 AM

## 2021-05-02 NOTE — Discharge Summary (Signed)
Postpartum Discharge Summary     Patient Name: Lisa Woods DOB: April 10, 1991 MRN: [id number]  Date of admission: 05/01/2021 Delivery date:05/02/2021  Delivering provider: Renard Matter  Date of discharge: 05/03/2021  Admitting diagnosis: Uterine contractions during pregnancy [O47.9] Normal labor [O80, Z37.9] Intrauterine pregnancy: [redacted]w[redacted]d    Secondary diagnosis:  Principal Problem:   Normal labor Active Problems:   [place]   Supervision of normal pregnancy in second trimester   Chorioamnionitis   Gestational hypertension  Additional problems: New Gestational Hypertension in Labor    Discharge diagnosis: Term Pregnancy Delivered and Preeclampsia (mild)                                              Post partum procedures: none Augmentation: AROM and Pitocin Complications: None  Hospital course: Onset of Labor With Vaginal Delivery      30y.o. yo [id number] 29w1das admitted in Active Labor on 05/01/2021. Patient had an uncomplicated labor course as follows:  Membrane Rupture Time/Date: 3:13 PM ,05/01/2021   Delivery Method:Vaginal, Spontaneous  Episiotomy: None  Lacerations:  None  Patient had an uncomplicated postpartum course.  She is ambulating, tolerating a regular diet, passing flatus, and urinating well. Patient is discharged home in stable condition on 05/03/21.  Newborn Data: Birth date:05/02/2021  Birth time:7:28 AM  Gender:Female  Living status:Living  Apgars:7 ,9  Weight:3940 g   Magnesium Sulfate received: No BMZ received: No Rhophylac:N/A MMR:Yes T-DaP:Given prenatally Flu: N/A Transfusion:No  Physical exam  Vitals:   05/02/21 1825 05/02/21 2000 05/02/21 2345 05/03/21 0405  BP: 134/79 129/80 122/79 129/79  Pulse: 81 86 74 70  Resp: _0 Temp: (!) 97.3 F (36.3 C) 97.8 F (36.6 C) 98.2 F (36.8 C) 97.9 F (36.6 C)  TempSrc: Oral Oral Oral Oral  SpO2:      Weight:      Height:       General: alert, cooperative, and no  distress Lochia: appropriate Uterine Fundus: firm Incision: N/A DVT Evaluation: No evidence of DVT seen on physical exam. Labs: Lab Results  Component Value Date   WBC 14.4 (H) 05/02/2021   HGB 11.0 (L) 05/02/2021   HCT 33.2 (L) 05/02/2021   MCV 86.2 05/02/2021   PLT 146 (L) 05/02/2021   CMP Latest Ref Rng & Units 05/02/2021  Glucose 70 - 99 mg/dL 89  BUN 6 - 20 mg/dL 7  Creatinine 0.44 - 1.00 mg/dL 1.38(H)  Sodium 135 - 145 mmol/L 133(L)  Potassium 3.5 - 5.1 mmol/L 4.1  Chloride 98 - 111 mmol/L 103  CO2 22 - 32 mmol/L 20(L)  Calcium 8.9 - 10.3 mg/dL 8.2(L)  Total Protein 6.5 - 8.1 g/dL 5.3(L)  Total Bilirubin 0.3 - 1.2 mg/dL 0.6  Alkaline Phos 38 - 126 U/L 122  AST 15 - 41 U/L 21  ALT 0 - 44 U/L 12   Edinburgh Score: No flowsheet data found.   After visit meds:  Allergies as of 05/03/2021   No Known Allergies      Medication List     STOP taking these medications    aspirin EC 81 MG tablet   clotrimazole 2 % vaginal cream Commonly known as: GYNE-LOTRIMIN 3       TAKE these medications    furosemide 20 MG tablet Commonly known as: Lasix Take 1 tablet (  20 mg total) by mouth daily for 5 days.   ibuprofen 600 MG tablet Commonly known as: ADVIL Take 1 tablet (600 mg total) by mouth every 6 (six) hours.   Prenatal Vitamins 28-0.8 MG Tabs Take 1 tablet by mouth daily.         Discharge home in stable condition Infant Feeding: Breast Infant Disposition:home with mother Discharge instruction: per After Visit Summary and Postpartum booklet. Activity: Advance as tolerated. Pelvic rest for 6 weeks.  Diet: routine diet Future Appointments:No future appointments. Follow up Visit:  [facility] Follow up in 1 week(s).   Specialty: Family Medicine Why: For BP check Contact information: [address] [phone] [place] [phone]                 Message sent to [facility] by Dr. Cy Blamer on 12/8  Please schedule this patient for a In person postpartum visit in 4 weeks with the following provider: Any provider. Additional Postpartum F/U:BP check 1 week  High risk pregnancy complicated by: HTN Delivery mode:  Vaginal, Spontaneous  Anticipated Birth Control:   depo   05/03/2021 Hansel Feinstein, CNM

## 2021-05-02 NOTE — Progress Notes (Signed)
Lisa Woods is a 30 y.o. G2P1001 at [redacted]w[redacted]d   admitted for spontaneous onset of labor .  Subjective: Feels well. Denies any abdominal or pelvic pressure.  Objective: BP (!) 143/88   Pulse 72   Temp 99.6 F (37.6 C) (Axillary)   Resp 16   Ht 5\' 5"  (1.651 m)   Wt 80 kg   LMP  (LMP Unknown)   SpO2 99%   BMI 29.35 kg/m  I/O last 3 completed shifts: In: 1446.8 [I.V.:1446.8] Out: 400 [Urine:400] No intake/output data recorded.  FHT:  FHR: 140 bpm, variability: moderate,  accelerations:  Present,  decelerations:  Absent UC:   regular, every 1-3 minutes Dilation: 8 Effacement (%): 90 Station: 0 Presentation: Vertex Exam by:: [email or web address]   Labs: Lab Results  Component Value Date   WBC 14.4 (H) 05/02/2021   HGB 11.0 (L) 05/02/2021   HCT 33.2 (L) 05/02/2021   MCV 86.2 05/02/2021   PLT 146 (L) 05/02/2021   Assessment / Plan: G2P1001 at [redacted]w[redacted]d  admitted for spontaneous onset of labor .  Labor:  Started amp/gent due to maternal fever. Given tylenol. Pitocin stopped due to decelerations. After 30 min of cat 1 tracing, pitocin was resumed. Progressing well. Titrate pit to complete.  gHTN:   vitals normal to mild range. Asymptomatic. Fetal Wellbeing:  category 2 Pain Control:  Epidural I/D:   GBS neg   [redacted]w[redacted]d PGY-2 05/02/2021, 2:53 AM

## 2021-05-02 NOTE — Lactation Note (Signed)
This note was copied from a baby's chart. Lactation Consultation Note  Patient Name: Lisa Woods TGGYI'R Date: 05/02/2021 Reason for consult: Initial assessment;Term Age:29 hours Mom feeding choice is breast and formula feeding. Most feedings have been formula. LC entered room at [facility] request, mom wanted  assistance with latching infant at breast, per mom, she is  unsure if infant would breastfed due to recently receiving 12 mls of formula. Per mom, infant will latch but will not  sustain latch when feeding.  Mom attempted to latch infant on her right breast using the football hold, infant latched with depth BF 2 minutes or less and fell asleep, probably due to recently being formula fed. Mom will continue to work towards latching infant at the breast and at next feeding she will ask RN/ LC for latch assistance before offering formula. Mom was given DEBP by RN and explained how to use DEBP, mom plans to pump every 3 hours for 15 minutes and give infant back any EBM before formula. Mom knows to breastfeed infant according to cues, 8 to 12+ or more times within 24 hours , skin to skin. Mom made aware of O/P services, breastfeeding support groups, community resources, and our phone # for post-discharge questions.   Maternal Data Has patient been taught Hand Expression?: Yes Does the patient have breastfeeding experience prior to this delivery?: Yes How long did the patient breastfeed?: Per mom, she briefly attempted to latch her 1st child but had latch difficultes.  Feeding Mother's Current Feeding Choice: Breast Milk and Formula  LATCH Score Latch: Repeated attempts needed to sustain latch, nipple held in mouth throughout feeding, stimulation needed to elicit sucking reflex.  Audible Swallowing: Spontaneous and intermittent  Type of Nipple: Everted at rest and after stimulation  Comfort (Breast/Nipple): Soft / non-tender  Hold (Positioning): Assistance needed to correctly  position infant at breast and maintain latch.  LATCH Score: 8   Lactation Tools Discussed/Used Tools: Pump Breast pump type: Double-Electric Breast Pump Pump Education: Setup, frequency, and cleaning;Milk Storage Reason for Pumping: RN gave mom DEBP due to infant being poor feeder Pumping frequency: Mom knows to pump every 3 hours for 15 minutes on inital setting.  Interventions    Discharge Pump: DEBP  Consult Status Consult Status: Follow-up Date: 05/03/21 Follow-up type: In-patient    Danelle Earthly 05/02/2021, 11:29 PM

## 2021-05-03 ENCOUNTER — Ambulatory Visit: Payer: Self-pay

## 2021-05-03 DIAGNOSIS — O139 Gestational [pregnancy-induced] hypertension without significant proteinuria, unspecified trimester: Secondary | ICD-10-CM | POA: Diagnosis present

## 2021-05-03 DIAGNOSIS — Z8759 Personal history of other complications of pregnancy, childbirth and the puerperium: Secondary | ICD-10-CM | POA: Diagnosis present

## 2021-05-03 MED ORDER — FUROSEMIDE 20 MG PO TABS: 20.0000 mg | ORAL_TABLET | Freq: Every day | ORAL | 0 refills | Status: DC

## 2021-05-03 MED ORDER — IBUPROFEN 600 MG PO TABS: 600.0000 mg | ORAL_TABLET | Freq: Four times a day (QID) | ORAL | 0 refills | Status: DC

## 2021-05-03 NOTE — Lactation Note (Signed)
This note was copied from a baby's chart. Lactation Consultation Note  Patient Name: Lisa Woods RSWNI'O Date: 05/03/2021 Reason for consult: Mother's request;Difficult latch;Follow-up assessment;Term;Infant weight loss (-1% weight loss) Age:29 hours LC entered the room, infant was cuing to breastfeed, mom wanted to breastfeed infant at this time with LC's assistance. Per mom, she has used the DEBP twice today and gave infant her EBM with a curve tip syringe. Mom made few attempts of latching infant on her right breast using the football hold position, infant only held nipple in mouth. Due to infant having multiple bottle nipples, mom fitted with 20 mm NS that was pre-filled with 0.5 mls of formula and infant elicited the SSB response and breastfed for 10 minutes. LC removed NS and infant re-latched and breastfeed for another 10 minutes without NS, infant breastfeed for total of 20 minutes with this feeding. Afterwards infant was given total of 20 mls of formula. Mom wanted to try re-latch infant, mom re-latched infant without LC assistance and infant was feeding again at the breast at 1814 pm. Mom will work towards latching infant at the breast and will ask RN/LC for latch assistance if needed. Mom's plan: 1- Mom will latch infant at the breast first  for every future feeding, BF by cues 8 to 12 times within 24 hours, skin to skin.  2- Mom will try latch infant without NS. 3- Mom will use DEBP every 3 hours for 15 minutes on initial setting and give infant back any EBM first before formula. 4- Mom will continue to supplement infant after latching infant at the breast with EBM first and then formula.  Maternal Data    Feeding Mother's Current Feeding Choice: Breast Milk and Formula Nipple Type: Nfant Standard Flow (white)  LATCH Score Latch: Grasps breast easily, tongue down, lips flanged, rhythmical sucking.  Audible Swallowing: Spontaneous and intermittent  Type of Nipple:  Everted at rest and after stimulation  Comfort (Breast/Nipple): Soft / non-tender  Hold (Positioning): Assistance needed to correctly position infant at breast and maintain latch.  LATCH Score: 9   Lactation Tools Discussed/Used Tools: Other (comment) (Infant took 14 mls with curve tip syringe on LC's gloved finger.)  Interventions Interventions: Position options;Support pillows;Adjust position;Breast compression;Education  Discharge    Consult Status Consult Status: Follow-up Date: 05/04/21 Follow-up type: In-patient    Lisa Woods 05/03/2021, 6:21 PM

## 2021-05-04 ENCOUNTER — Ambulatory Visit: Payer: Self-pay

## 2021-05-04 NOTE — Lactation Note (Signed)
This note was copied from a baby's chart. Lactation Consultation Note  Patient Name: Girl Dhalia Zingaro IOMBT'D Date: 05/04/2021 Reason for consult: Follow-up assessment;Term;Infant weight loss;Other (Comment) (2 % weight loss/ /per mom baby recently fed 30 ml from the bottle / sleeping at present. per mom baby has been breast feeding. LC discussed Supply and demand. Mom mentioned she was concerned her milk was not in yet.) Age:29 hours Per mom the baby had a stool in the sac at delivery and hasn't stooled since.  Adequate wets for age.  LC reviewed BF D/C teaching / and mom aware of her BF resources after D/C.     Maternal Data    Feeding Mother's Current Feeding Choice: Breast Milk and Formula Nipple Type: Nfant Standard Flow (white)  LATCH Score                    Lactation Tools Discussed/Used Tools: Pump;Flanges Flange Size: 24;27 Breast pump type: Double-Electric Breast Pump;Manual Pump Education: Milk Storage Reason for Pumping: LC reviewed the hand pump for D/C  Interventions Interventions: Breast feeding basics reviewed;Hand pump;DEBP;Education;LC Services brochure  Discharge Discharge Education: Engorgement and breast care;Warning signs for feeding baby Pump: Manual WIC Program: Yes  Consult Status Consult Status: Follow-up Date: 05/04/21    Kathrin Greathouse 05/04/2021, 10:19 AM

## 2021-05-06 ENCOUNTER — Telehealth: Payer: Self-pay

## 2021-05-06 LAB — SURGICAL PATHOLOGY

## 2021-05-06 NOTE — Telephone Encounter (Signed)
Transition Care Management Follow-up Telephone Call Date of discharge and from where: 05/03/2021 from [facility] How have you been since you were released from the hospital? Pt stated that she is feeling well and did not have any questions or concerns.  Any questions or concerns? No  Items Reviewed: Did the pt receive and understand the discharge instructions provided? Yes  Medications obtained and verified? Yes  Other? No  Any new allergies since your discharge? No  Dietary orders reviewed? No Do you have support at home? Yes   Functional Questionnaire: (I = Independent and D = Dependent) ADLs: I Bathing/Dressing- I Meal Prep- I Eating- I Maintaining continence- I Transferring/Ambulation- I Managing Meds- I   Follow up appointments reviewed:  PCP Hospital f/u appt confirmed? No   Specialist Hospital f/u appt confirmed? No  Pt to call and schedule follow up.  Are transportation arrangements needed? No  If their condition worsens, is the pt aware to call PCP or go to the Emergency Dept.? Yes Was the patient provided with contact information for the PCP's office or ED? Yes Was to pt encouraged to call back with questions or concerns? Yes

## 2021-05-07 NOTE — Patient Instructions (Signed)
It was wonderful to meet you today.  Today we talked about:  Your blood pressure today was good at 125/81.  -Ive sent in a steroid suppository that you can use twice a day for a week.  -Use MiraLAX daily or as needed to help you have soft and regular bowel movements.  -Continue your prenatal vitamins -When you return for your 6- week post-partum visit we can do the Depo.   Thank you for choosing [facility].   Please call [phone] with any questions about today's appointment.  Please be sure to schedule follow up at the front  desk before you leave today.   Sabino Dick, DO PGY-2 Family Medicine

## 2021-05-07 NOTE — Progress Notes (Signed)
° ° °  SUBJECTIVE:   CHIEF COMPLAINT / HPI:   Post-Partum Blood Pressure Check Lisa Woods is a 30 y.o. female G2P2002 who recently delivered a healthy baby girl on 12/8. Pregnancy was complicated by gestational hypertension, chorioamnionitis and thrombocytopenia. She returns to the clinic today for a blood pressure follow up. She denies headaches, SOB, RUQ abdominal pain, vision changes, or swelling of extremities.   Breast and formula feeding- similac. Had some pain with breast feeding but is still pumping and putting baby to the breast. Feels that mood is good and is feeling well supported.   Hemorrhoids Patient does note worsening of rectal hemorrhoids since delivery of her newborn.  States that she has had hemorrhoids ever since she was pregnant with her 22-year-old daughter.  He seemed to worsen at time of delivery.  She has been using rectal cream then which hazel pads which help with the pain.  She did report that she was at a holiday concert yesterday and had a lot of sitting which likely worsened it.  She reports regular bowel movements and does not frequently strain.  Denies any rectal bleeding.  PERTINENT  PMH / PSH:  Past Medical History:  Diagnosis Date   Chlamydia     OBJECTIVE:   BP 125/81    Pulse 93    Ht 5\' 5"  (1.651 m)    Wt 178 lb (80.7 kg)    LMP  (LMP Unknown)    SpO2 99%    BMI 29.62 kg/m    General: NAD, pleasant, able to participate in exam Cardiac: RRR, no murmurs. Respiratory: CTAB, normal effort, No wheezes, rales or rhonchi Abdomen: Bowel sounds present, nontender Extremities: no edema or cyanosis. Skin: warm and dry, no rashes noted Neuro: alert, no obvious focal deficits Psych: Normal affect and mood    ASSESSMENT/PLAN:   Postpartum care and examination Reports that gestational hypertension was diagnosed at time of delivery.  She is not on any medications for this.  Blood pressure today is 125/81.  Patient is doing well postpartum, no mood concerns.   [place] postnatal depression scale score of 1.  PHQ-9 score 0.  She has been complaining of worsening hemorrhoids since delivery. She is using dibucaine and witch hazel pads which help. No rectal bleeding. She declined GU examination.  -No medications for BP at this time -7-day supply of hydrocortisone rectal suppositories sent to pharmacy -Discussed importance of preventing constipation to prevent new or worsening of hemorrhoids -MiraLAX sent to pharmacy  -Recommended sitz baths -Return precautions provided for symptoms of preeclampsia/DVT/PE -ASA 81 mg was safely discarded by , CMA     Clabe Seal, DO [place] [facility]

## 2021-05-08 ENCOUNTER — Ambulatory Visit: Payer: Medicaid Other | Admitting: Family Medicine

## 2021-05-08 ENCOUNTER — Other Ambulatory Visit: Payer: Self-pay

## 2021-05-08 MED ORDER — HYDROCORTISONE ACETATE 25 MG RE SUPP: 25.0000 mg | Freq: Two times a day (BID) | RECTAL | 0 refills | Status: AC

## 2021-05-08 MED ORDER — POLYETHYLENE GLYCOL 3350 17 GM/SCOOP PO POWD: 1.0000 | Freq: Every day | ORAL | 1 refills | Status: DC

## 2021-05-08 NOTE — Assessment & Plan Note (Addendum)
Reports that gestational hypertension was diagnosed at time of delivery.  She is not on any medications for this.  Blood pressure today is 125/81.  Patient is doing well postpartum, no mood concerns.  [place] postnatal depression scale score of 1.  PHQ-9 score 0.  She has been complaining of worsening hemorrhoids since delivery. She is using dibucaine and witch hazel pads which help. No rectal bleeding. She declined GU examination.  -No medications for BP at this time -7-day supply of hydrocortisone rectal suppositories sent to pharmacy -Discussed importance of preventing constipation to prevent new or worsening of hemorrhoids -MiraLAX sent to pharmacy  -Recommended sitz baths -Return precautions provided for symptoms of preeclampsia/DVT/PE -ASA 81 mg was safely discarded by Clabe Seal, CMA

## 2021-05-14 ENCOUNTER — Encounter: Payer: Self-pay | Admitting: Family Medicine

## 2021-05-16 ENCOUNTER — Telehealth (HOSPITAL_COMMUNITY): Payer: Self-pay | Admitting: *Deleted

## 2021-05-16 NOTE — Telephone Encounter (Signed)
Patient voiced no questions or concerns at this time. EPDS=1. Patient voiced no questions or concerns regarding infant at this time. Patient reports infant sleeps in a crib on her back. RN reviewed ABCs of safe sleep. Patient verbalized understanding. Patient requested RN email information on hospital's virtual postpartum classes and support groups. Email sent. Deforest Hoyles, RN, 05/16/21, [phone]

## 2021-05-22 NOTE — Telephone Encounter (Signed)
Patient scheduled to see provider this afternoon at 2:30 pm.   Veronda Prude, RN

## 2021-06-27 NOTE — Progress Notes (Deleted)
°  [place] Postpartum Visit   Lisa Woods is a 31 y.o. [phone] presenting for a postpartum visit.  She has the following concerns today: *** Her pregnancy was complicated by gestational hypertension, chorioamnionitis and thrombocytopenia. She delivered vaginally at [facility] on 05/02/21.  She reports her vaginal bleeding is ***. She is *** feeding her infant. She feels she is bonding well. She is considering *** for contraception.   Edinburgh Postnatal Depression Scale: *** (10 or higher is positive)  Reviewed pregnancy and delivery course ***.  -  There were no vitals filed for this visit. Exam: *** Pelvic exam: ***   A/P:  Postpartum visit: patient is 8 weeks postpartum following a *** delivery. -Discussed patients delivery*** and complications -Patient had a *** degree laceration, perineal healing reviewed. Patient expressed understanding -Patient has urinary incontinence? {yes/no:20286}*** Patient was referred to pelvic floor PT  -Patient {ACTION; IS/IS GI:087931 safe to resume physical and sexual activity -Patient {DOES_DOES JZ:4998275 want a pregnancy in the next year.  Desired family size is {NUMBER 1-10:22536} children.  -Reviewed forms of contraception in tiered fashion. Patient desired {PLAN CONTRACEPTION:313102} today.   -Return to sexual activity and contraception discussed as above.  -Discussed birth spacing of 18 months -Breastfeeding: {yes/no:20286}, provided support of decision and resources as indicated  -Mood: ***  -Discussed sleep and fatigue management and encouraged family/community support.  -Postpartum vaccines: *** -Need for postpartum diabetes screening:  none (2 hour glucose tolerance testing between 6-12 weeks postpartum)  -Reviewed prior Pap and she is next due for Pap ***.  -Patient had elevated scr to 1.38 on 12/8, baseline is 0.8-0.9. Will recheck with BMP today. -Return in No follow-ups on file.

## 2021-06-28 ENCOUNTER — Ambulatory Visit: Payer: Medicaid Other | Admitting: Family Medicine

## 2021-08-22 NOTE — Progress Notes (Deleted)
?  [facility] Postpartum Visit  ? ?Lisa Woods is a 31 y.o. [phone] presenting for a postpartum visit.  ?She has the following concerns today: *** ?She delivered via SVD at [redacted]w[redacted]d.  ?She reports her vaginal bleeding is ***. She is *** feeding her infant. She feels she is bonding well. She is considering *** for contraception.  ?Postpartum: patient delivered a healthy infant on 05/02/21 and was last seen postpartum on 05/08/21 for a blood pressure check. Pregnancy was complicated by gestational hypertension, chorioamnionitis, mild pre-eclampsia and thrombocytopenia ? ?Edinburgh Postnatal Depression Scale: *** (10 or higher is positive)  ?Reviewed pregnancy and delivery course ***.  ?-  ?There were no vitals filed for this visit. ?Exam: *** ?Pelvic exam: ***  ? ?A/P:  ?Postpartum visit: patient is 16 weeks postpartum following a spontaneous vaginal delivery. ?-Discussed patients delivery*** and complications ?-Patient had no perineal laceration, perineal healing reviewed. Patient expressed understanding ?-Patient has urinary incontinence? {yes/no:20286}*** Patient was referred to pelvic floor PT  ?-Patient {ACTION; IS/IS AVW:09811914} safe to resume physical and sexual activity ?-Patient {DOES_DOES NWG:95621} want a pregnancy in the next year.  Desired family size is {NUMBER 1-10:22536} children.  ?-Reviewed forms of contraception in tiered fashion. Patient desired {PLAN CONTRACEPTION:313102} today.   ?-Return to sexual activity and contraception discussed as above.  ?-Discussed birth spacing of 18 months ?-Breastfeeding: {yes/no:20286}, provided support of decision and resources as indicated  ?-Mood: ***  ?-Discussed sleep and fatigue management and encouraged family/community support.  ?-Postpartum vaccines: *** ?-Need for postpartum diabetes screening:  *** (2 hour glucose tolerance testing between 6-12 weeks postpartum)  ?-Reviewed prior Pap and she is next due for Pap ***.  ?-Return in No  follow-ups on file. ? ?

## 2021-08-23 ENCOUNTER — Ambulatory Visit: Payer: Medicaid Other | Admitting: Family Medicine

## 2021-08-26 ENCOUNTER — Ambulatory Visit: Payer: Medicaid Other | Admitting: Family Medicine

## 2021-08-26 VITALS — BP 108/73 | HR 85 | Ht 65.0 in | Wt 191.8 lb

## 2021-08-26 DIAGNOSIS — Z3202 Encounter for pregnancy test, result negative: Secondary | ICD-10-CM

## 2021-08-26 DIAGNOSIS — Z309 Encounter for contraceptive management, unspecified: Secondary | ICD-10-CM

## 2021-08-26 DIAGNOSIS — Z30011 Encounter for initial prescription of contraceptive pills: Secondary | ICD-10-CM

## 2021-08-26 LAB — POCT URINE PREGNANCY: Preg Test, Ur: NEGATIVE

## 2021-08-26 MED ORDER — NORETHIN ACE-ETH ESTRAD-FE 1.5-30 MG-MCG PO TABS: 1.0000 | ORAL_TABLET | Freq: Every day | ORAL | 11 refills | Status: DC

## 2021-08-26 NOTE — Patient Instructions (Signed)
It was wonderful seeing you today.  I am glad everything has been going well since your delivery.  They are going to start you on an oral birth control pill which she will take daily.  If you have any questions or concerns please call the clinic.  I hope you have a wonderful day! ? ? ? ? ?

## 2021-08-26 NOTE — Progress Notes (Deleted)
?  [facility] Postpartum Visit  ? ?Lisa Woods is a 30 y.o. G2P2002 presenting for a postpartum visit.  ?She has the following concerns today: *** ?She delivered via SVD at [redacted]w[redacted]d.  ?She reports her vaginal bleeding is ***. She is *** feeding her infant. She feels she is bonding well. She is considering *** for contraception.  ?Postpartum: patient delivered a healthy infant on 05/02/21 and was last seen postpartum on 05/08/21 for a blood pressure check. Pregnancy was complicated by gestational hypertension, chorioamnionitis, mild pre-eclampsia and thrombocytopenia ? ?Edinburgh Postnatal Depression Scale: *** (10 or higher is positive)  ?Reviewed pregnancy and delivery course ***.  ?-  ?There were no vitals filed for this visit. ?Exam: *** ?Pelvic exam: ***  ? ?A/P:  ?Postpartum visit: patient is 16 weeks postpartum following a spontaneous vaginal delivery. ?-Discussed patients delivery*** and complications ?-Patient had no perineal laceration, perineal healing reviewed. Patient expressed understanding ?-Patient has urinary incontinence? {yes/no:20286}*** Patient was referred to pelvic floor PT  ?-Patient {ACTION; IS/IS NOT:21021397} safe to resume physical and sexual activity ?-Patient {DOES_DOES NOT:18564} want a pregnancy in the next year.  Desired family size is {NUMBER 1-10:22536} children.  ?-Reviewed forms of contraception in tiered fashion. Patient desired {PLAN CONTRACEPTION:313102} today.   ?-Return to sexual activity and contraception discussed as above.  ?-Discussed birth spacing of 18 months ?-Breastfeeding: {yes/no:20286}, provided support of decision and resources as indicated  ?-Mood: ***  ?-Discussed sleep and fatigue management and encouraged family/community support.  ?-Postpartum vaccines: *** ?-Need for postpartum diabetes screening:  *** (2 hour glucose tolerance testing between 6-12 weeks postpartum)  ?-Reviewed prior Pap and she is next due for Pap ***.  ?-Return in No  follow-ups on file. ? ?

## 2021-08-26 NOTE — Progress Notes (Signed)
?  [facility] Postpartum Visit  ? ?Lisa Woods is a 31 y.o. [phone] presenting for a postpartum visit.  ?She has the following concerns today: neck pain and birth control  ?She delivered via SVD at [redacted]w[redacted]d.  ?She reports her vaginal bleeding has resolved. She is formula(Gerber good start gentle) feeding her infant. She feels she is bonding well. She is considering OCPs for contraception.  ?Postpartum: patient delivered a healthy infant on 05/02/21 and was last seen postpartum on 05/08/21 for a blood pressure check. Pregnancy was complicated by gestational hypertension, chorioamnionitis, mild pre-eclampsia and thrombocytopenia ? ?Edinburgh Postnatal Depression Scale: 0 (10 or higher is positive)  ?Reviewed pregnancy and delivery course.  ?-  ?Vitals:  ? 08/26/21 1054  ?BP: 108/73  ?Pulse: 85  ?SpO2: 100%  ? ?Exam: Pleasant, well-appearing, no acute distress ?Pelvic exam: declined at this time  ? ?A/P:  ?Postpartum visit: patient is 16 weeks postpartum following a spontaneous vaginal delivery. ?-Discussed patients delivery and complications ?-Patient had no perineal laceration, perineal healing reviewed. Patient expressed understanding ?-Patient is safe to resume physical and sexual activity ?-Patient does not want a pregnancy in the next year.  Desired family size is 3 children.  ?-Reviewed forms of contraception in tiered fashion. Patient desired oral contraceptives (estrogen/progesterone) today.   ?-Return to sexual activity and contraception discussed as above.  ?-Discussed birth spacing of 18 months ?-Breastfeeding: No, provided support of decision and resources as indicated  ?-Mood: Good   ?-Discussed sleep and fatigue management and encouraged family/community support.  ?-Postpartum vaccines: None needed ?-Reviewed prior Pap and she is next due for Pap 11/02/22.  ?-Return in No follow-ups on file. ? ?

## 2021-09-20 ENCOUNTER — Encounter: Payer: Self-pay | Admitting: Family Medicine

## 2021-09-20 ENCOUNTER — Ambulatory Visit: Payer: Medicaid Other | Admitting: Family Medicine

## 2021-09-20 ENCOUNTER — Ambulatory Visit (INDEPENDENT_AMBULATORY_CARE_PROVIDER_SITE_OTHER): Payer: Medicaid Other | Admitting: Family Medicine

## 2021-09-20 VITALS — BP 125/73 | HR 80 | Ht 65.0 in | Wt 193.0 lb

## 2021-09-20 DIAGNOSIS — K648 Other hemorrhoids: Secondary | ICD-10-CM | POA: Diagnosis not present

## 2021-09-20 DIAGNOSIS — K649 Unspecified hemorrhoids: Secondary | ICD-10-CM | POA: Diagnosis not present

## 2021-09-20 DIAGNOSIS — L739 Follicular disorder, unspecified: Secondary | ICD-10-CM

## 2021-09-20 DIAGNOSIS — Z130 Encounter for screening for diseases of the blood and blood-forming organs and certain disorders involving the immune mechanism: Secondary | ICD-10-CM

## 2021-09-20 MED ORDER — MUPIROCIN 2 % EX OINT: 1.0000 "application " | TOPICAL_OINTMENT | Freq: Two times a day (BID) | CUTANEOUS | 0 refills | Status: DC

## 2021-09-20 MED ORDER — HYDROCORTISONE ACETATE 25 MG RE SUPP: 25.0000 mg | Freq: Two times a day (BID) | RECTAL | 0 refills | Status: DC

## 2021-09-20 NOTE — Progress Notes (Signed)
    SUBJECTIVE:   CHIEF COMPLAINT / HPI:   Hemorrhoid: patient reports that she has known hemorrhoids since last year prior to birth of her daughter. These have been manageable, however in the last two week she has had several episodes of bright red blood with stools. Her stools of been liquid, and the toilet bowl has been filled with bright red blood. She has only mild pain and itching. She declines DRE and anoscopy today. Patient denies dizziness, weakness.   Folliculitis: patient had episode of folliculitis at top of gluteal cleft. No induration, warmth, drainage.   PERTINENT  PMH / PSH: hemorrhoid  OBJECTIVE:   BP 125/73   Pulse 80   Ht 5\' 5"  (1.651 m)   Wt 193 lb (87.5 kg)   LMP 08/21/2021   SpO2 100%   BMI 32.12 kg/m   Nursing note and vitals reviewed GEN: young AAW, resting comfortably in chair, NAD, WNWD Skin: 3 mm pustule at apex of gluteal cleft Neuro: AOx3  Ext: no edema Psych: Pleasant and appropriate  ASSESSMENT/PLAN:   Internal hemorrhoids Patient with symptomatic hemorrhoids. Treat with anusol, sitz baths, witch hazel. Checked CBC given fair amount of blood in stool, reassuringly normal. Discussed with patient at length options, including recommendation for exam, which she declined. Due to symptoms, patient would like referral to gen surgery for discussion of banding. Referral placed.  Folliculitis Discussed symptom management, return precautions. Given bactroban BID x 2 weeks.     Gladys Damme, MD [place]

## 2021-09-20 NOTE — Patient Instructions (Addendum)
It was a pleasure to see you today! ? ?We will get some labs today.  If they are abnormal or we need to do something about them, I will call you.  If they are normal, I will send you a message on MyChart (if it is active) or a letter in the mail.  If you don't hear from [place] in 2 weeks, please call the office  [phone]. ?I have placed a referral for general surgery. You should receive a phone call in 1-2 weeks to schedule this appointment. If for any reason you do not receive a phone call or need more help scheduling this appointment, please call our office at [phone]. ?For loose stools, you can try adding in a fiber supplement such as metamucil to bulk up your stools.  ?For hemorrhoid: you can try things like sitz bath (sit in 6 inches of warm water with epsom salt) for relief, also anusol suppositories can be used up to twice a day (this may help with itching and may shrink the hemorrhoid), you can also try witch hazel wipes. If no improvement, you can follow up here and I would recommend an exam at that time, or follow up with general surgery. ? ? ?Be Well, ? ?Dr. Leary Roca ? ?

## 2021-09-21 LAB — CBC WITH DIFFERENTIAL/PLATELET
Basophils Absolute: 0 10*3/uL (ref 0.0–0.2)
Basos: 0 %
EOS (ABSOLUTE): 0.1 10*3/uL (ref 0.0–0.4)
Eos: 1 %
Hematocrit: 37.5 % (ref 34.0–46.6)
Hemoglobin: 12.5 g/dL (ref 11.1–15.9)
Immature Grans (Abs): 0 10*3/uL (ref 0.0–0.1)
Immature Granulocytes: 0 %
Lymphocytes Absolute: 2.2 10*3/uL (ref 0.7–3.1)
Lymphs: 34 %
MCH: 29.6 pg (ref 26.6–33.0)
MCHC: 33.3 g/dL (ref 31.5–35.7)
MCV: 89 fL (ref 79–97)
Monocytes Absolute: 0.4 10*3/uL (ref 0.1–0.9)
Monocytes: 6 %
Neutrophils Absolute: 3.9 10*3/uL (ref 1.4–7.0)
Neutrophils: 59 %
Platelets: 193 10*3/uL (ref 150–450)
RBC: 4.23 x10E6/uL (ref 3.77–5.28)
RDW: 12.6 % (ref 11.7–15.4)
WBC: 6.6 10*3/uL (ref 3.4–10.8)

## 2021-09-25 DIAGNOSIS — L739 Follicular disorder, unspecified: Secondary | ICD-10-CM | POA: Insufficient documentation

## 2021-09-25 NOTE — Assessment & Plan Note (Signed)
Patient with symptomatic hemorrhoids. Treat with anusol, sitz baths, witch hazel. Checked CBC given fair amount of blood in stool, reassuringly normal. Discussed with patient at length options, including recommendation for exam, which she declined. Due to symptoms, patient would like referral to gen surgery for discussion of banding. Referral placed. ?

## 2021-09-25 NOTE — Assessment & Plan Note (Signed)
Discussed symptom management, return precautions. Given bactroban BID x 2 weeks. ?

## 2021-10-27 ENCOUNTER — Encounter: Payer: Self-pay | Admitting: Family Medicine

## 2021-10-29 ENCOUNTER — Encounter: Payer: Self-pay | Admitting: *Deleted

## 2021-11-06 ENCOUNTER — Other Ambulatory Visit: Payer: Self-pay | Admitting: Family Medicine

## 2021-11-06 DIAGNOSIS — L739 Follicular disorder, unspecified: Secondary | ICD-10-CM

## 2021-11-07 MED ORDER — MUPIROCIN 2 % EX OINT: TOPICAL_OINTMENT | Freq: Two times a day (BID) | CUTANEOUS | 0 refills | Status: DC

## 2022-01-05 ENCOUNTER — Encounter: Payer: Self-pay | Admitting: Family Medicine

## 2022-01-07 ENCOUNTER — Ambulatory Visit: Payer: Medicaid Other | Admitting: Student

## 2022-04-29 ENCOUNTER — Encounter: Payer: Self-pay | Admitting: Family Medicine

## 2022-04-29 ENCOUNTER — Ambulatory Visit (INDEPENDENT_AMBULATORY_CARE_PROVIDER_SITE_OTHER): Payer: Medicaid Other | Admitting: Family Medicine

## 2022-04-29 VITALS — BP 118/62 | HR 92 | Wt 183.6 lb

## 2022-04-29 DIAGNOSIS — Z30013 Encounter for initial prescription of injectable contraceptive: Secondary | ICD-10-CM

## 2022-04-29 DIAGNOSIS — L732 Hidradenitis suppurativa: Secondary | ICD-10-CM

## 2022-04-29 LAB — POCT URINE PREGNANCY: Preg Test, Ur: NEGATIVE

## 2022-04-29 MED ORDER — CHLORHEXIDINE GLUCONATE 4 % EX LIQD: Freq: Every day | CUTANEOUS | 1 refills | Status: DC | PRN

## 2022-04-29 MED ORDER — MEDROXYPROGESTERONE ACETATE 150 MG/ML IM SUSP
150.0000 mg | Freq: Once | INTRAMUSCULAR | Status: AC
  Administered 2022-04-29: 150 mg via INTRAMUSCULAR

## 2022-04-29 MED ORDER — DOXYCYCLINE HYCLATE 100 MG PO TABS: 100.0000 mg | ORAL_TABLET | Freq: Two times a day (BID) | ORAL | 0 refills | Status: DC

## 2022-04-29 NOTE — Assessment & Plan Note (Addendum)
Discussed options today, she would like to stop the OCPs and go with Depo, discussed the risk of abnormal vaginal bleeding with Depo but she has tried this in the past and finds the side effect profile preferable to her current bleeding with OCPs Not sexually active for the past month, urine pregnancy test negative today, Depo given Discussed backup contraception versus abstinence for the next week No contraindications including no history of VTE, no history of migraine with aura, no history of hypertension

## 2022-04-29 NOTE — Progress Notes (Signed)
    SUBJECTIVE:   CHIEF COMPLAINT / HPI:   Boil - on her upper buttock, gets them in her groin and sometimes in her armpit.  None under the breast.  Is painful and has been draining.  Tried a cream over-the-counter mupirocin which has not helped.  Boil on her back has been present for a year, she has had other lesions in her groin and armpits for the past 2 years.  Sometimes these are present with shaving but she does not shave her bottom.   Contraception management- in the past has used OCPs.  Having multiple periods a month with OCPs and she has been using these since the delivery of her child.  She has tried the IUD in the past and did not like it due to pain.  She has tried Depo in the past and had some abnormal bleeding but liked it and would like to try it again today.  She has not been sexually active for the past month.  Her last menstrual period was last week.  PERTINENT  PMH / PSH: hydradenitis, tobacco use disorder  OBJECTIVE:   BP 118/62   Pulse 92   Wt 183 lb 9.6 oz (83.3 kg)   LMP  (LMP Unknown)   SpO2 98%   BMI 30.55 kg/m   General: alert & oriented, no apparent distress, well groomed HEENT: normocephalic, atraumatic, EOM grossly intact, oral mucosa moist, neck supple Respiratory: normal respiratory effort GI: non-distended Skin: On left side of upper gluteal cleft, small 0.5 cm ovular erythematous lesion with a pinpoint pustule, draining.  Hyperpigmentation scarring in left armpit and along groin inguinal crease consistent with previous lesions Psych: appropriate mood and affect  Clemencia Course CMA present for GU exam as chaperone   ASSESSMENT/PLAN:   Hidradenitis Lesions on buttock, groin and armpits consistent with possible hidradenitis lesions No need for incision and drainage and no induration today Discussed options with patient we will do chlorhexidine wash 3 times a week as well as twice daily doxycycline for 3 months Discussed taking doxycycline with food  and risk of dyspepsia  Contraception management Discussed options today, she would like to stop the OCPs and go with Depo, discussed the risk of abnormal vaginal bleeding with Depo but she has tried this in the past and finds the side effect profile preferable to her current bleeding with OCPs Not sexually active for the past month, urine pregnancy test negative today, Depo given Discussed backup contraception versus abstinence for the next week No contraindications including no history of VTE, no history of migraine with aura, no history of hypertension     Billey Co, MD [facility]

## 2022-04-29 NOTE — Addendum Note (Signed)
Addended by: Georges Lynch T on: 04/29/2022 10:23 AM   Modules accepted: Orders

## 2022-04-29 NOTE — Assessment & Plan Note (Addendum)
Lesions on buttock, groin and armpits consistent with possible hidradenitis lesions No need for incision and drainage and no induration today Discussed options with patient we will do chlorhexidine wash 3 times a week as well as twice daily doxycycline for 3 months Discussed taking doxycycline with food and risk of dyspepsia

## 2022-04-29 NOTE — Progress Notes (Signed)
Patient here today for Depo Provera injection and is within her dates.    Last contraceptive appt was 04/29/2022  Depo given in [facility]OQ today.  Site unremarkable & patient tolerated injection.    Next injection due 07/15/2022-07/30/2022.  Reminder card given.    Sunday Spillers, CMA

## 2022-04-29 NOTE — Patient Instructions (Addendum)
It was wonderful to see you today.  Please bring ALL of your medications with you to every visit.   Today we talked about:  We gave you Depo today, you can stop your oral birth control pills 1 week after or remain abstinent for one week after.  For your boil, I think this might be hydradenitis suppurativa. We will start doxycycline 100 mg twice daily x 3 months. I also sent a chlorhexidine wash to use a couple times a week in your arm pits, under breasts, groin and buttocks.   Thank you for choosing [facility].   Please call [phone] with any questions about today's appointment.  Please be sure to schedule follow up in 4 weeks at the front  desk before you leave today.   Please arrive at least 15 minutes prior to your scheduled appointments.   If you had blood work today, I will send you a MyChart message or a letter if results are normal. Otherwise, I will give you a call.   If you had a referral placed, they will call you to set up an appointment. Please give [place] a call if you don't hear back in the next 2 weeks.   If you need additional refills before your next appointment, please call your pharmacy first.   Burley Saver, MD  Family Medicine

## 2022-06-10 ENCOUNTER — Ambulatory Visit: Payer: Medicaid Other | Admitting: Student

## 2022-07-04 ENCOUNTER — Other Ambulatory Visit: Payer: Self-pay | Admitting: Family Medicine

## 2022-07-04 DIAGNOSIS — L732 Hidradenitis suppurativa: Secondary | ICD-10-CM

## 2022-07-04 NOTE — Telephone Encounter (Signed)
Spoke with patient, who confirmed her identity.  She reports that her lesions have completely resolved with doxycycline.  She was requesting refills of doxycycline because she was unsure if she needed to take it for more than 3 months.  I discussed that if the lesions have resolved, there is no need to continue doxycycline at this time.  Watchful waiting of 2 to 3 months is appropriate.  If lesions recur, she will reach out to [place].  Medication refill denied.

## 2022-07-17 ENCOUNTER — Encounter: Payer: Self-pay | Admitting: Family Medicine

## 2022-07-17 ENCOUNTER — Ambulatory Visit: Payer: Medicaid Other | Admitting: Family Medicine

## 2022-07-17 VITALS — BP 105/71 | HR 82 | Ht 65.0 in | Wt 183.4 lb

## 2022-07-17 DIAGNOSIS — N6320 Unspecified lump in the left breast, unspecified quadrant: Secondary | ICD-10-CM | POA: Diagnosis not present

## 2022-07-17 NOTE — Progress Notes (Signed)
  SUBJECTIVE:   CHIEF COMPLAINT / HPI:   Here for concern of blackheads noted on breast.  At last visit in December 2023, was noted to have concern for hidradenitis, started on chlorhexidine wash as well as twice daily doxycycline   Does not feel that her current symptoms are similar to her hidradenitis.  She has finished her course of doxycycline and chlorhexidine wash and her initial hidradenitis symptoms have resolved.  Not currently taking any other medications.  She has 2 blackheads that have been present on her breast for a while, but within the last week, one of them has started become a little bit more inflamed and painful.  It has not drained anything including pus or blood.  Denies systemic symptoms including fever, illness.  She does endorse a family history of breast cancer in her mother, who was diagnosed at an early age (in her 63s)  74  PMH / [place]: Hidradenitis  OBJECTIVE:  BP 105/71   Pulse 82   Ht 5' 5"$  (1.651 m)   Wt 183 lb 6.4 oz (83.2 kg)   SpO2 100%   BMI 30.52 kg/m   General: NAD, pleasant, able to participate in exam Respiratory: No respiratory distress Skin: warm and dry, no rashes noted Psych: Normal affect and mood  Breast: Left breast: At 12-1:00, small 1 cm slightly mobile mass noted.  Minimally fluctuant.  There is a dark-colored pore noted above the mass.  The area is slightly sore to touch.  There is some surrounding erythema/discoloration.  Slightly below this, there is a another blackhead/pore noted without any erythema/discoloration, or underlying mass.  No other masses palpated throughout the left breast and axillary region. Right breast: Normal appearance, no masses palpated throughout the right breast and axillary region.  ASSESSMENT/PLAN:   Mass of left breast, unspecified quadrant Assessment & Plan: Given appearance and inflammation with overlying pore, feel this is likely an epidermal inclusion cyst.  Does not appear to be infected, low  suspicion for underlying abscess.  Discussed supportive care with warm compresses, but feel that she would be a good candidate for I&D/removal of this lesion in our dermatology clinic.  She wishes to try supportive care at this time but states she will call back and try to get scheduled in Derm clinic if she changes her mind. Given her family history of breast cancer (mother diagnosed in her 43s), will obtain diagnostic mammogram +/- ultrasound of left breast given the palpable mass and tenderness.  Reassuringly, no other masses palpated on bilateral breast exam.  Orders: -     MM Digital Diagnostic Unilat L; Future    Return if symptoms worsen or fail to improve.  August Albino, MD [facility]

## 2022-07-17 NOTE — Assessment & Plan Note (Addendum)
Given appearance and inflammation with overlying pore, feel this is likely an epidermal inclusion cyst.  Does not appear to be infected, low suspicion for underlying abscess.  Discussed supportive care with warm compresses, but feel that she would be a good candidate for I&D/removal of this lesion in our dermatology clinic.  She wishes to try supportive care at this time but states she will call back and try to get scheduled in Derm clinic if she changes her mind.   Given her family history of breast cancer (mother diagnosed in her 1s), will obtain diagnostic mammogram +/- ultrasound of left breast given the palpable mass and tenderness.  Reassuringly, no other masses palpated on bilateral breast exam.

## 2022-07-17 NOTE — Patient Instructions (Addendum)
Please call the [facility] at [phone] to schedule a mammogram  Please use warm compresses to help ease your symptoms - feel free to give [place] a call and get scheduled in our dermatology clinic if you are interested in having it removed

## 2022-07-24 ENCOUNTER — Other Ambulatory Visit: Payer: Self-pay | Admitting: Family Medicine

## 2022-07-24 DIAGNOSIS — N6320 Unspecified lump in the left breast, unspecified quadrant: Secondary | ICD-10-CM

## 2022-08-04 ENCOUNTER — Other Ambulatory Visit: Payer: Self-pay | Admitting: Family Medicine

## 2022-08-04 ENCOUNTER — Ambulatory Visit: Payer: Medicaid Other | Admitting: Family Medicine

## 2022-08-04 VITALS — BP 105/80 | HR 81 | Temp 99.2°F | Ht 65.0 in | Wt 185.4 lb

## 2022-08-04 DIAGNOSIS — Z309 Encounter for contraceptive management, unspecified: Secondary | ICD-10-CM

## 2022-08-04 DIAGNOSIS — L732 Hidradenitis suppurativa: Secondary | ICD-10-CM

## 2022-08-04 DIAGNOSIS — Z3200 Encounter for pregnancy test, result unknown: Secondary | ICD-10-CM

## 2022-08-04 LAB — POCT URINE PREGNANCY: Preg Test, Ur: NEGATIVE

## 2022-08-04 MED ORDER — DOXYCYCLINE HYCLATE 100 MG PO TABS: 100.0000 mg | ORAL_TABLET | Freq: Two times a day (BID) | ORAL | 0 refills | Status: DC

## 2022-08-04 MED ORDER — MEDROXYPROGESTERONE ACETATE 150 MG/ML IM SUSY
150.0000 mg | PREFILLED_SYRINGE | Freq: Once | INTRAMUSCULAR | Status: AC
  Administered 2022-08-04: 150 mg via INTRAMUSCULAR

## 2022-08-04 MED ORDER — CHLORHEXIDINE GLUCONATE 4 % EX LIQD: Freq: Every day | CUTANEOUS | 1 refills | Status: AC | PRN

## 2022-08-04 NOTE — Patient Instructions (Addendum)
It was wonderful to see you today.  Please bring ALL of your medications with you to every visit.   Today we talked about:  I have refilled a couple weeks of the doxycycline.  Continue the chlorhexidine washes 3 times a day.  Also sending referral to dermatology for further management.   Return if you develop fevers, chills, other concerns.   Mark down the dates for your Depo!   Thank you for coming to your visit as scheduled. We have had a large "no-show" problem lately, and this significantly limits our ability to see and care for patients. As a friendly reminder- if you cannot make your appointment please call to cancel. We do have a no show policy for those who do not cancel within 24 hours. Our policy is that if you miss or fail to cancel an appointment within 24 hours, 3 times in a 90-monthperiod, you may be dismissed from our clinic.   Thank you for choosing [facility]   Please call [phone] any questions about today's appointment.  Please be sure to schedule follow up at the front  desk before you leave today.   ASharion Settler DO PGY-3 Family Medicine

## 2022-08-04 NOTE — Progress Notes (Signed)
    SUBJECTIVE:   CHIEF COMPLAINT / HPI:   Lisa Woods is a 32 y.o. female who presents to the [facility] clinic today to discuss the following concerns:   Boil on Buttocks Was last seen in December for concerns of hidradenitis with lesions to buttock, groins, armpits.  At that time was started on chlorhexidine wash 3 times daily as well as twice daily doxycycline for 3 months.She states that the medication was helping.   States that she has been off of the medications for her HS for about a month.   She states that on Friday it started to become painful, Saturday it "became filled". Today it started draining.  No fevers or chills. States that this is only the second time this has happened.   Contraception Out of dates for Depo. Would like to continue on Depo.   PERTINENT  PMH / PSH: Tobacco use disorder, HS  OBJECTIVE:   BP 105/80   Pulse 81   Temp 99.2 F (37.3 C)   Ht 5\' 5"  (1.651 m)   Wt 185 lb 6.4 oz (84.1 kg)   SpO2 99%   BMI 30.85 kg/m    General: NAD, pleasant, able to participate in exam Respiratory: normal effort Skin: Upper right buttocks with approximately 1x2 cm lesion near gluteal crease. Draining yellow.  Psych: Normal affect and mood  ASSESSMENT/PLAN:   1. Hidradenitis  Draining but does not appear infected. Had good success with chlorhexidine washes and doxycycline in the past.  We discussed concerns with long-term antibiotics.  Would benefit from dermatology referral for further management.  - Refilled 2 week supply of Doxy - Continue chlorhexidine washes - Amb ref to Dermatology   2. Encounter for pregnancy test, result unknown Pregnancy test negative today.  - POCT urine pregnancy - Depo provera injection today   Sharion Settler, [place]

## 2022-08-04 NOTE — Progress Notes (Signed)
Patient presents for Depo shot and is outside her window.  Pregnancy test was negative.  Patient given shot in RUOQ and tolerated it well.  Patient given reminder card for 27 May-Jun 10.  Lisa Woods, [place]

## 2022-08-09 ENCOUNTER — Ambulatory Visit: Payer: Medicaid Other

## 2022-08-14 IMAGING — US US OB COMP LESS 14 WK
1 series · 15 of 28 positions shown · non-contrast
Comparison: None for this gestation

CLINICAL DATA: First trimester pregnancy, uncertain LMP, dating

EXAM:
OBSTETRIC <14 WK ULTRASOUND
TECHNIQUE: Transabdominal ultrasound was performed for evaluation of the
gestation as well as the maternal uterus and adnexal regions.

[Series 1: us ob comp less 14 wk · 15 of 36 slices shown]
[im 1/36]
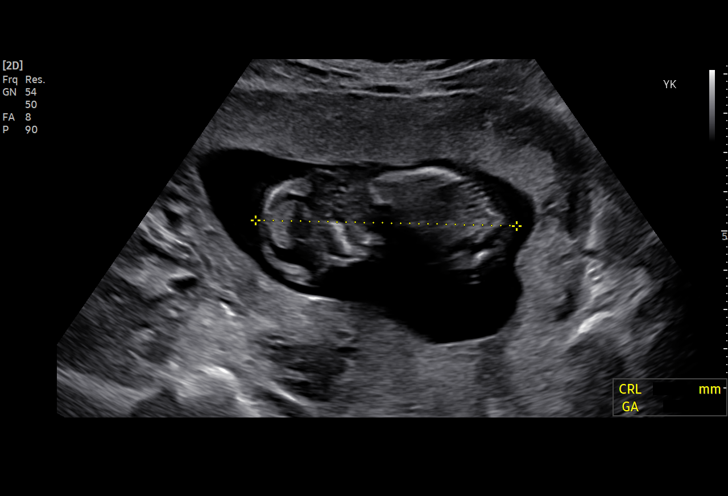
[im 3/36]
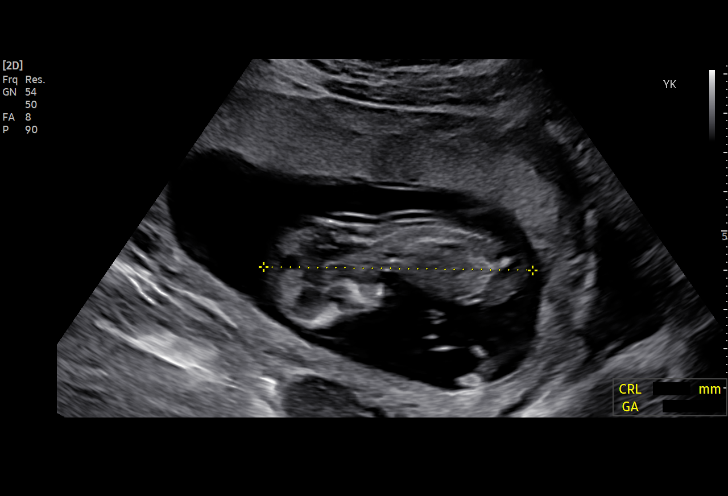
[im 6/36]
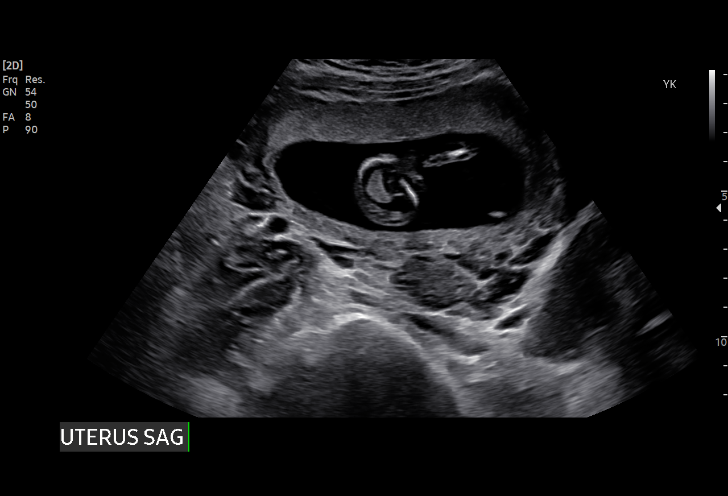
[im 8/36]
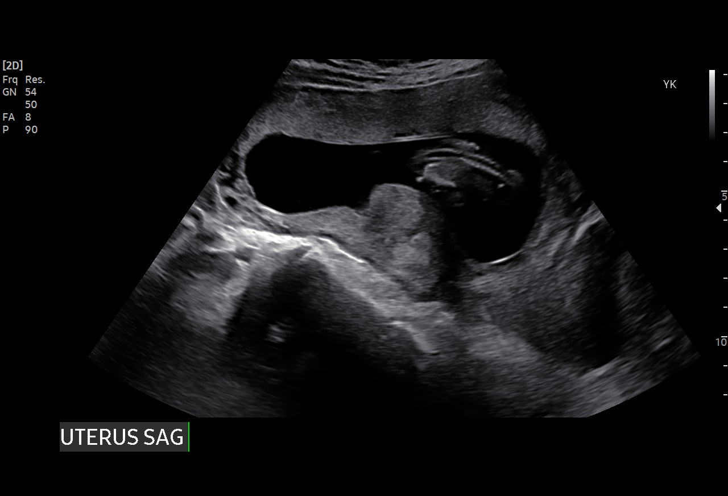
[im 11/36]
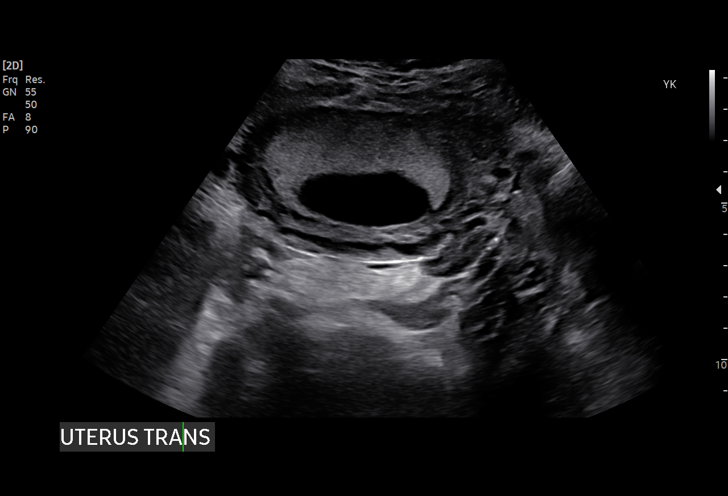
[im 13/36]
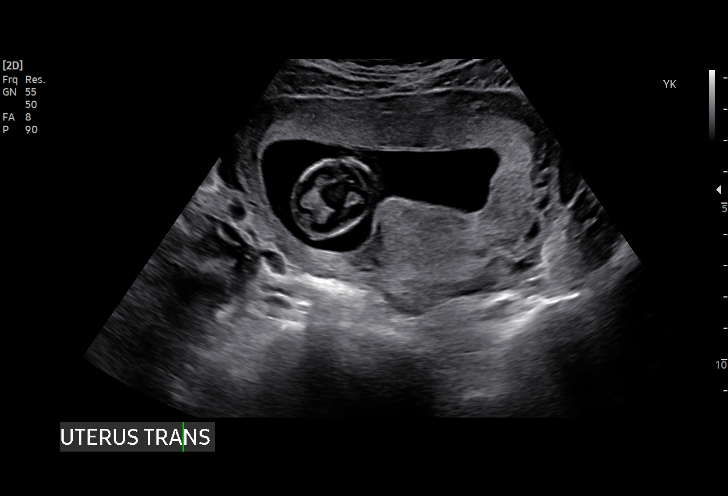
[im 16/36]
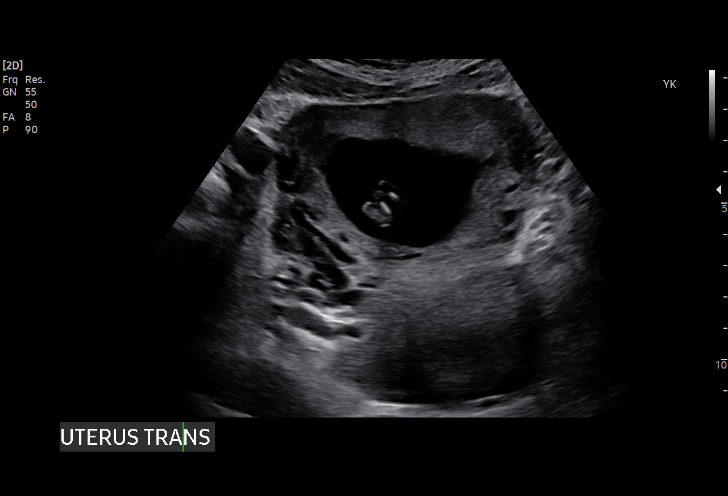
[im 19/36]
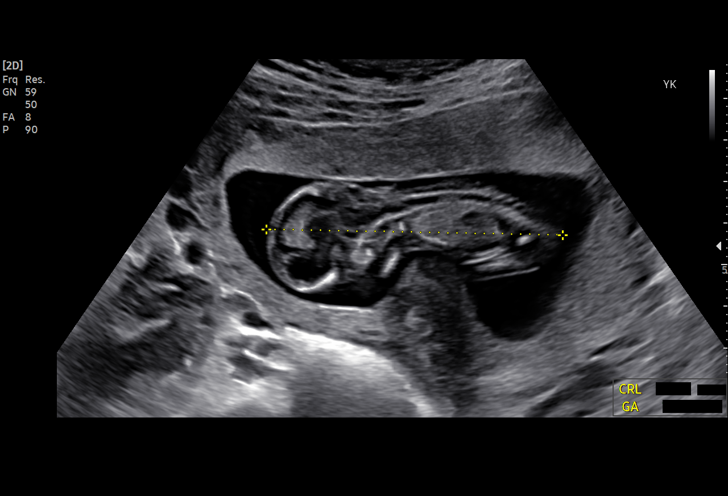
[im 20/36]
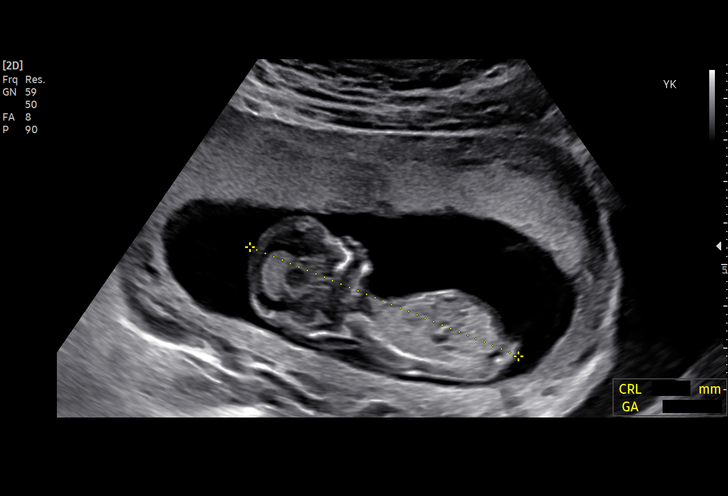
[im 23/36]
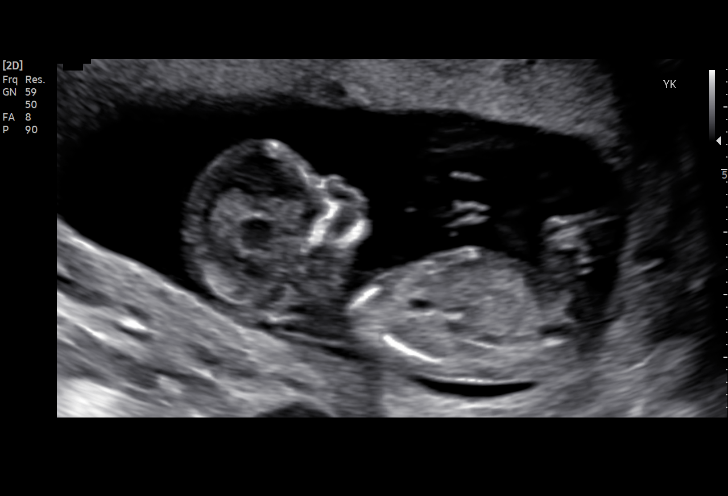
[im 25/36]
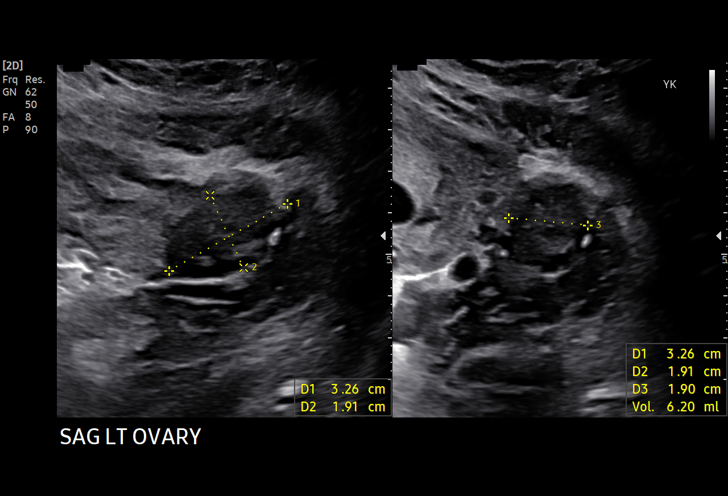
[im 28/36]
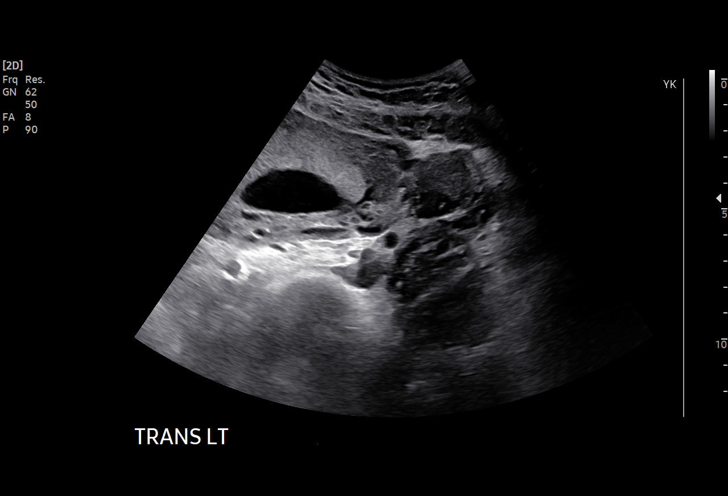
[im 30/36]
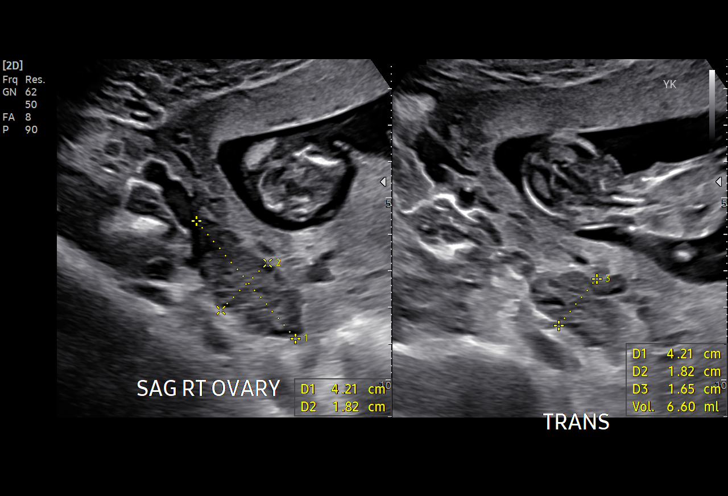
[im 33/36]
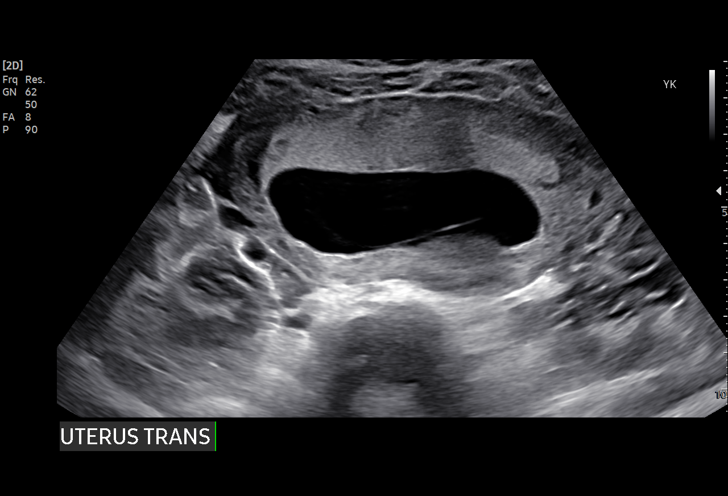
[im 36/36]
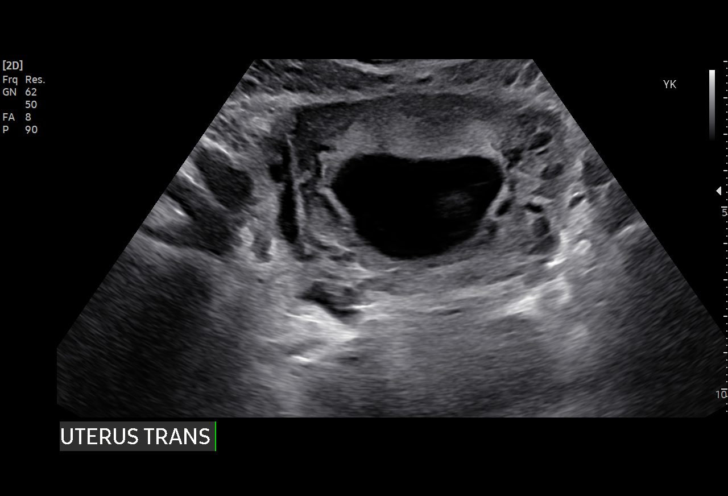

[15 of 28 positions shown; findings below may reference images not displayed]

FINDINGS: Intrauterine gestational sac: Present, single

Yolk sac:  Present

Embryo:  Present

Cardiac Activity: Present

Heart Rate: 162 bpm

CRL:   69.6 mm   13 w 1 d                  US EDC: 05/01/2021

Subchorionic hemorrhage:  None visualized.

Maternal uterus/adnexae:

Maternal uterus otherwise normal appearance.

LEFT ovary normal size and morphology 3.3 x 1.9 x 1.9 cm.

RIGHT ovary normal size and morphology 4.2 x 1.8 x 1.7 cm.

No free pelvic fluid or adnexal masses.
IMPRESSION: Single live intrauterine gestation at 13 weeks 1 day EGA by
crown-rump length.

No acute abnormalities.

## 2022-08-16 ENCOUNTER — Encounter: Payer: Self-pay | Admitting: Family Medicine

## 2022-08-25 ENCOUNTER — Encounter: Payer: Self-pay | Admitting: *Deleted

## 2022-09-19 ENCOUNTER — Other Ambulatory Visit: Payer: Self-pay | Admitting: Student

## 2022-09-19 DIAGNOSIS — K648 Other hemorrhoids: Secondary | ICD-10-CM

## 2022-09-19 NOTE — Progress Notes (Signed)
Patient previously referred to Gen Surg for surgical consult of internal hemorrhoids. Did not follow-up with them. New referral placed at patient's request.

## 2022-09-25 ENCOUNTER — Ambulatory Visit: Payer: Medicaid Other

## 2022-11-14 DIAGNOSIS — L0232 Furuncle of buttock: Secondary | ICD-10-CM | POA: Diagnosis not present

## 2022-11-17 ENCOUNTER — Ambulatory Visit: Payer: Self-pay | Admitting: Surgery

## 2022-11-17 DIAGNOSIS — K644 Residual hemorrhoidal skin tags: Secondary | ICD-10-CM | POA: Diagnosis not present

## 2022-11-17 DIAGNOSIS — K625 Hemorrhage of anus and rectum: Secondary | ICD-10-CM | POA: Diagnosis not present

## 2022-11-17 DIAGNOSIS — K643 Fourth degree hemorrhoids: Secondary | ICD-10-CM | POA: Diagnosis not present

## 2023-01-13 DIAGNOSIS — K029 Dental caries, unspecified: Secondary | ICD-10-CM | POA: Diagnosis not present

## 2023-01-15 IMAGING — US US MFM OB FOLLOW-UP
1 series · 14 of 19 positions shown · non-contrast
Comparison: none

[Series 1: us mfm ob follow-up · 19 acquisitions, 14 frames shown]
[im 1/19]
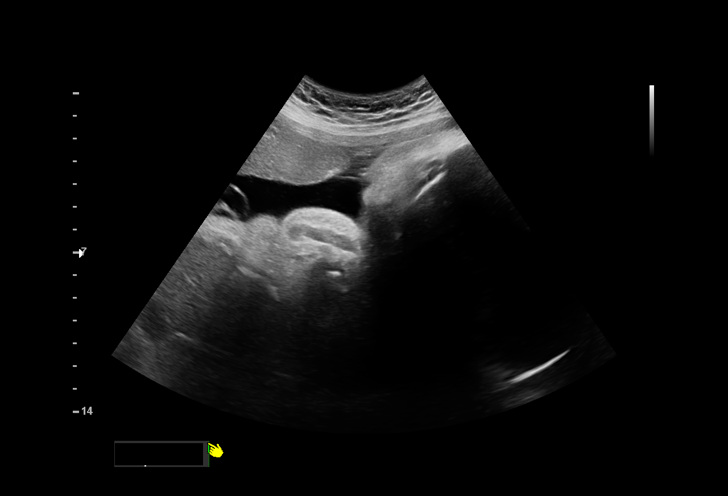
[im 3/19]
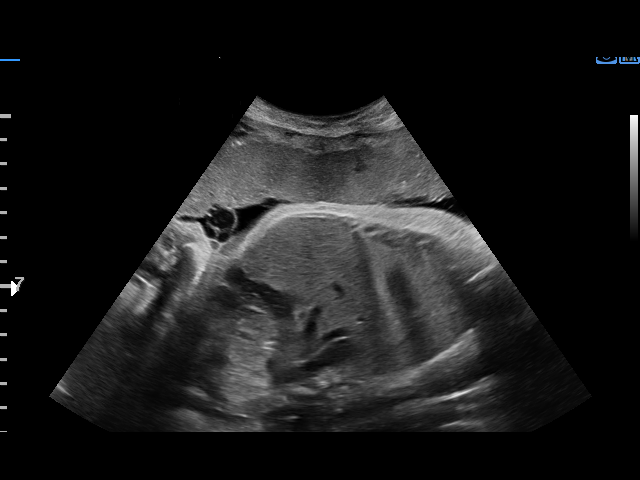
[im 4/19]
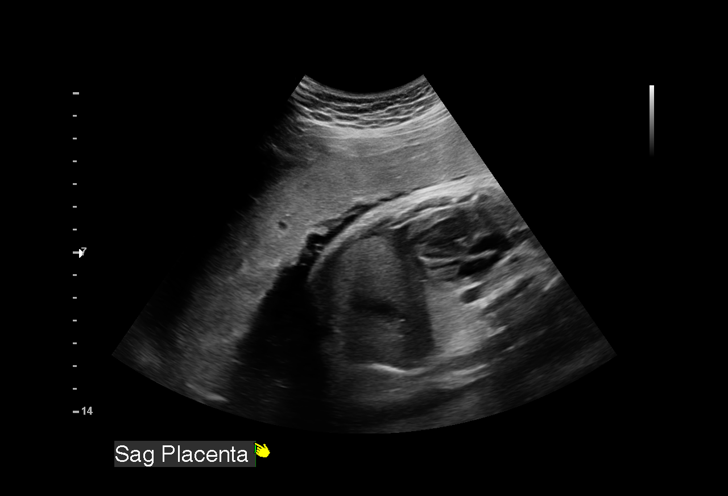
[im 5/19]
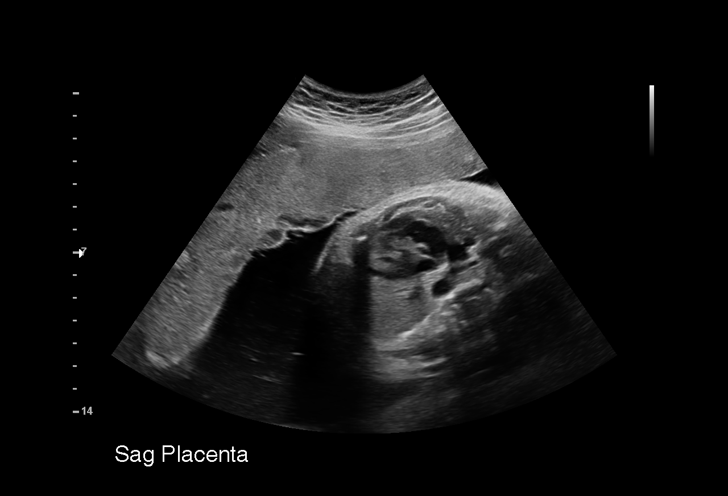
[im 7/19]
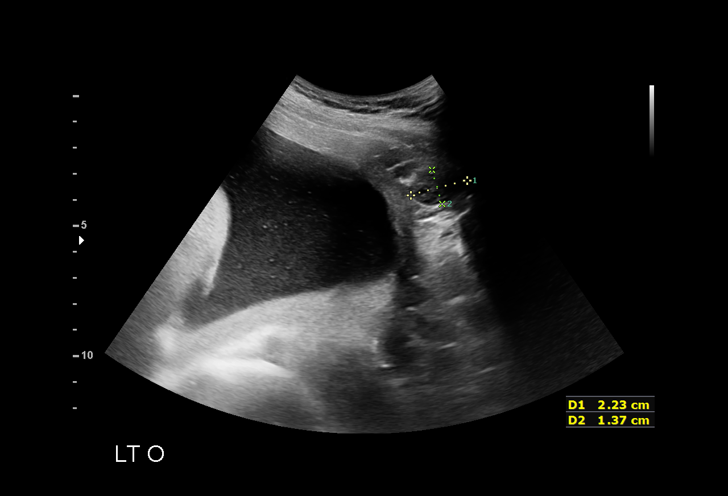
[im 8/19]
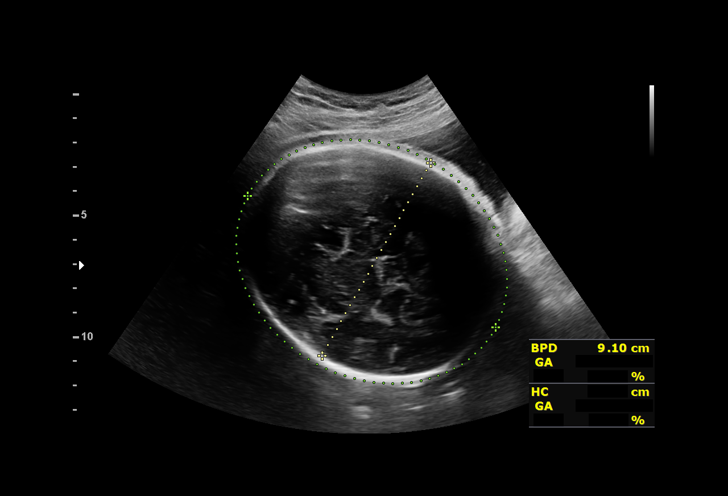
[im 9/19]
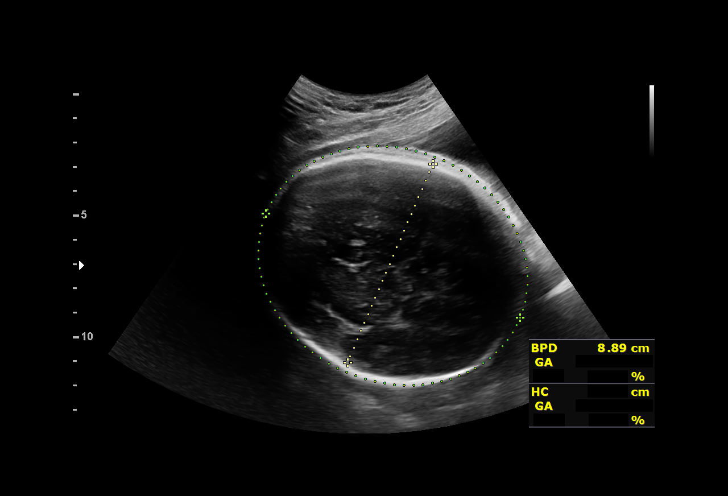
[im 11/19]
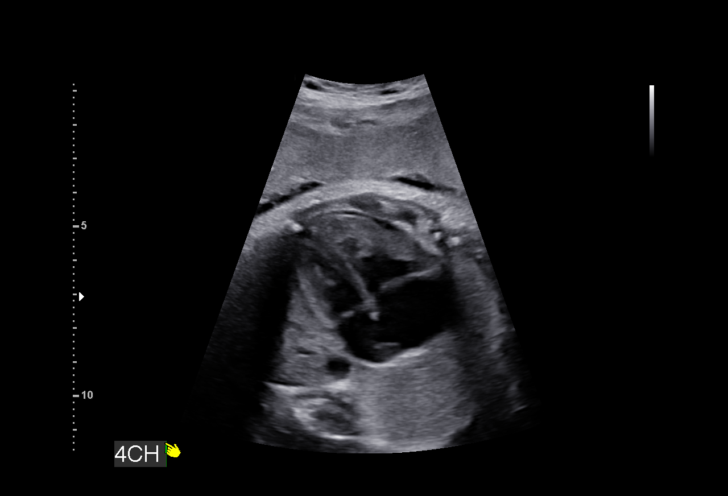
[im 12/19]
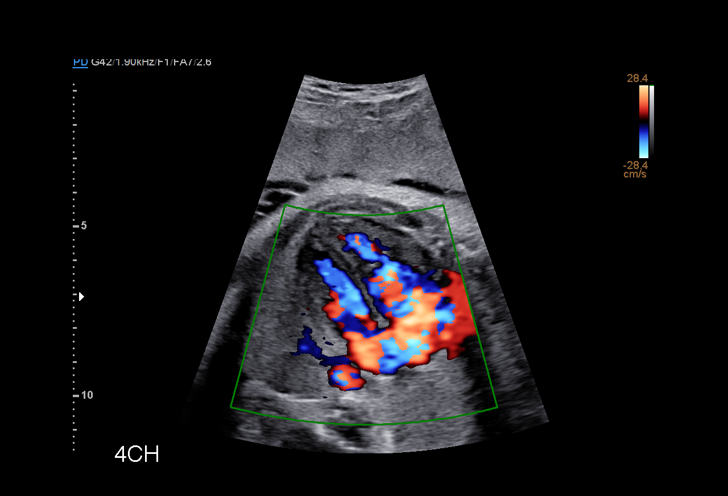
[im 13/19]
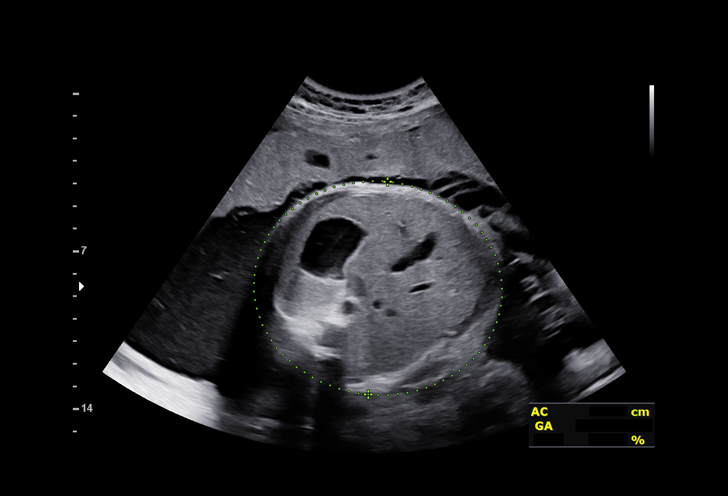
[im 15/19]
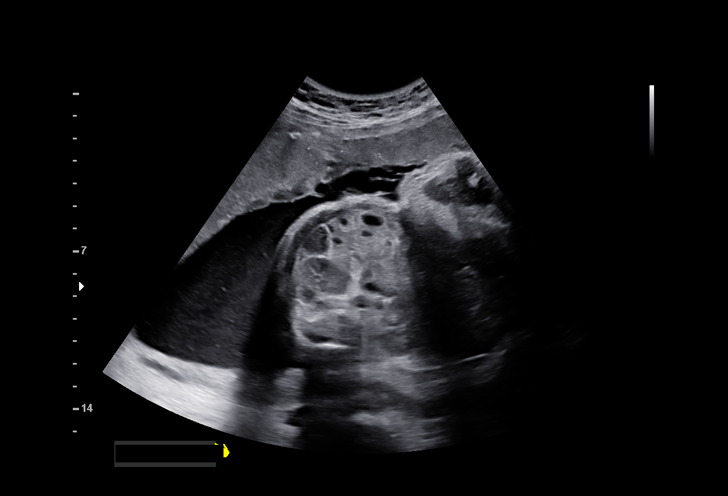
[im 16/19]
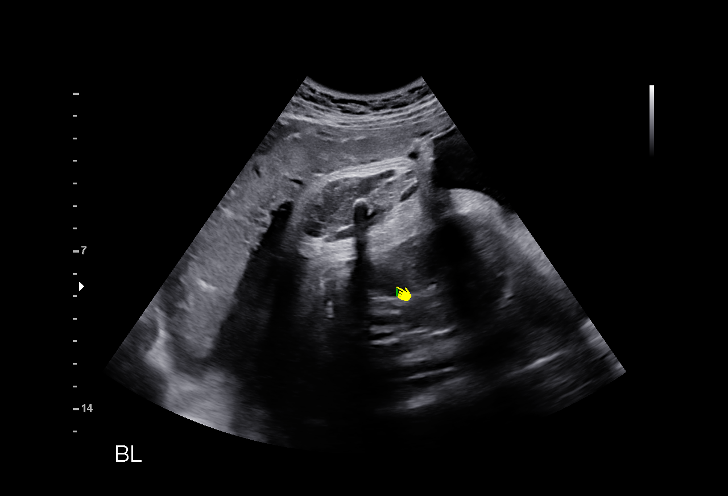
[im 17/19]
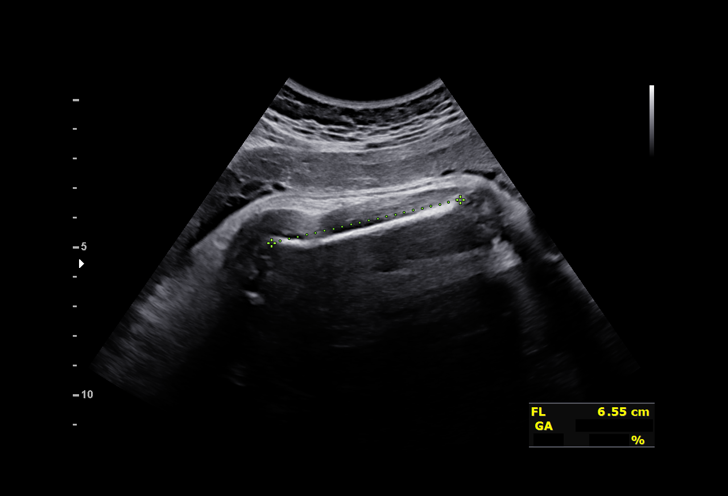
[im 19/19]
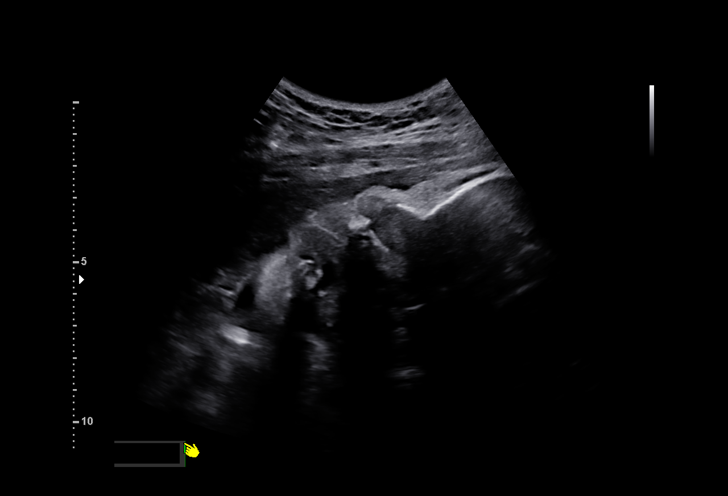

[14 of 19 positions shown; findings below may reference images not displayed]

Indications

 Echogenic intracardiac focus of the heart
 (EIF)
 Tobacco use complicating pregnancy
 LR NIPS
 35 weeks gestation of pregnancy
Fetal Evaluation

 Num Of Fetuses:         1
 Fetal Heart Rate(bpm):  136
 Cardiac Activity:       Observed
 Presentation:           Cephalic
 Placenta:               Anterior
 P. Cord Insertion:      Previously Visualized

 Amniotic Fluid
 AFI FV:      Within normal limits

 AFI Sum(cm)     %Tile       Largest Pocket(cm)
 17.84           66

 RUQ(cm)       RLQ(cm)       LUQ(cm)        LLQ(cm)

Biometry
 BPD:        90  mm     G. Age:  36w 3d         86  %    CI:        74.49   %    70 - 86
                                                         FL/HC:      19.6   %    20.1 -
 HC:       331   mm     G. Age:  37w 5d         79  %    HC/AC:      1.03        0.93 -
 AC:      321.6  mm     G. Age:  36w 1d         82  %    FL/BPD:     72.1   %    71 - 87
 FL:       64.9  mm     G. Age:  33w 3d          9  %    FL/AC:      20.2   %    20 - 24

 LV:        3.6  mm

 Est. FW:    5959  gm           6 lb     61  %
OB History

 Blood Type:   AB+
 Gravidity:    2         Term:   1
Gestational Age

 U/S Today:     36w 0d                                        EDD:   04/25/21
 Best:          35w 1d     Det. By:  Early Ultrasound         EDD:   05/01/21
                                     (10/25/20)
Anatomy

 Cranium:               Appears normal         Aortic Arch:            Previously seen
 Cavum:                 Appears normal         Ductal Arch:            Previously seen
 Ventricles:            Appears normal         Diaphragm:              Previously seen
 Choroid Plexus:        Previously seen        Stomach:                Appears normal, left
                                                                       sided
 Cerebellum:            Previously seen        Abdomen:                Previously seen
 Posterior Fossa:       Previously seen        Abdominal Wall:         Previously seen
 Nuchal Fold:           Previously seen        Cord Vessels:           Previously seen
 Face:                  Orbits and profile     Kidneys:                Appear normal
                        previously seen
 Lips:                  Previously seen        Bladder:                Appears normal
 Thoracic:              Previously seen        Spine:                  Previously seen
 Heart:                 Appears normal; EIF    Upper Extremities:      Visualized
 RVOT:                  Previously seen        Lower Extremities:      Visualized
 LVOT:                  Previously seen

 Other:  VC, 3VV and 3VTV previously visualized. Female gender previously
         seen.
Cervix Uterus Adnexa

 Cervix
 Not visualized (advanced GA >24wks)

 Right Ovary
 Visualized.

 Left Ovary
 Visualized.
Impression

 Follow up growth due to prior mild polyhydramnios.
 Normal interval growth with measurements consistent with
 dates
 Good fetal movement and normal amniotic fluid volume
 Ms. Menoikos reports good fetal movement.
Recommendations

 Follow up as clinically indicated.

## 2023-02-06 DIAGNOSIS — L0232 Furuncle of buttock: Secondary | ICD-10-CM | POA: Diagnosis not present

## 2023-02-06 DIAGNOSIS — L219 Seborrheic dermatitis, unspecified: Secondary | ICD-10-CM | POA: Diagnosis not present

## 2023-02-27 ENCOUNTER — Other Ambulatory Visit: Payer: Self-pay | Admitting: Surgery

## 2023-02-27 DIAGNOSIS — K644 Residual hemorrhoidal skin tags: Secondary | ICD-10-CM | POA: Diagnosis not present

## 2023-02-27 DIAGNOSIS — L0592 Pilonidal sinus without abscess: Secondary | ICD-10-CM | POA: Diagnosis not present

## 2023-02-27 DIAGNOSIS — K642 Third degree hemorrhoids: Secondary | ICD-10-CM | POA: Diagnosis not present

## 2023-02-27 DIAGNOSIS — L0591 Pilonidal cyst without abscess: Secondary | ICD-10-CM | POA: Diagnosis not present

## 2023-02-27 DIAGNOSIS — K649 Unspecified hemorrhoids: Secondary | ICD-10-CM | POA: Diagnosis not present

## 2023-03-03 LAB — SURGICAL PATHOLOGY

## 2023-04-07 ENCOUNTER — Ambulatory Visit: Payer: Medicaid Other

## 2023-04-07 NOTE — Progress Notes (Deleted)
  SUBJECTIVE:   CHIEF COMPLAINT / HPI:   Ear pain:  PERTINENT  PMH / PSH: Hidradenitis  OBJECTIVE:  There were no vitals taken for this visit. Physical Exam   ASSESSMENT/PLAN:   Assessment & Plan  No follow-ups on file. Shelby Mattocks, DO 04/07/2023, 8:07 AM PGY-3,  Family Medicine {    This will disappear when note is signed, click to select method of visit    :1}

## 2023-04-08 ENCOUNTER — Ambulatory Visit (INDEPENDENT_AMBULATORY_CARE_PROVIDER_SITE_OTHER): Payer: Medicaid Other | Admitting: Family Medicine

## 2023-04-08 VITALS — BP 118/72 | HR 67 | Temp 98.2°F | Wt 175.8 lb

## 2023-04-08 DIAGNOSIS — H9202 Otalgia, left ear: Secondary | ICD-10-CM | POA: Diagnosis not present

## 2023-04-08 NOTE — Patient Instructions (Signed)
It was wonderful to see you today.  Please bring ALL of your medications with you to every visit.   Today we talked about:  Ear pain - You most likely have some congestion behind your ear caused by a virus like the cold that is going around. If you continue to have pain you can try flonase. Also can take tylenol as this will go away on its own.   Thank you for choosing [facility].   Please call [phone] with any questions about today's appointment.  Lockie Mola, MD  Family Medicine

## 2023-04-09 NOTE — Progress Notes (Signed)
  SUBJECTIVE:   CHIEF COMPLAINT / HPI:   Ear pain: Patient reports she had some ear pain and fullness yesterday in her left ear. Her daughter has had URI symptoms with some ear tugging for the last two days. Patient denies fever. Says the pain is slightly better today. Denies congestion for herself. Denies tinnitis or dizziness.   PERTINENT  PMH / PSH: Hidradenitis  OBJECTIVE:   Vitals:   04/08/23 1010  BP: 118/72  Pulse: 67  Temp: 98.2 F (36.8 C)  SpO2: 99%    General: well appearing, in no acute distress HEENT: bilateral TM with cloudy appearance, no clear effusion behind either. No erythema or bulging.  CV: RRR, radial pulses equal and palpable, no BLE edema  Resp: Normal work of breathing on room air, CTAB Abd: Soft, non tender, non distended  Neuro: Alert & Oriented x 4    ASSESSMENT/PLAN:   Assessment & Plan  Assessment & Plan Ear pain, left Most likely some congestion and fullness  vs eustachian tube dysfunction in the setting of a viral URI. Recommended patient use some flonase if she continues to have pain, but if it has resolved not very concerning. Less likely as her daughter was also sick, but could also be seasonal allergies and would try zyrtec in that situation.  - Counseled on conservative therapies  - Flonase or zyrtec as needed

## 2023-04-29 DIAGNOSIS — Z0120 Encounter for dental examination and cleaning without abnormal findings: Secondary | ICD-10-CM | POA: Diagnosis not present

## 2023-05-01 ENCOUNTER — Ambulatory Visit: Payer: Medicaid Other | Admitting: Student

## 2023-05-12 ENCOUNTER — Ambulatory Visit: Payer: Medicaid Other | Admitting: Student

## 2023-05-15 ENCOUNTER — Ambulatory Visit (INDEPENDENT_AMBULATORY_CARE_PROVIDER_SITE_OTHER): Payer: Medicaid Other | Admitting: Family Medicine

## 2023-05-15 ENCOUNTER — Other Ambulatory Visit (HOSPITAL_COMMUNITY)
Admission: RE | Admit: 2023-05-15 | Discharge: 2023-05-15 | Disposition: A | Discharge status: Home / Self Care | Payer: Medicaid Other | Source: Ambulatory Visit | Attending: Family Medicine | Admitting: Family Medicine

## 2023-05-15 ENCOUNTER — Encounter: Payer: Self-pay | Admitting: Family Medicine

## 2023-05-15 VITALS — BP 128/71 | HR 86 | Ht 65.0 in | Wt 179.6 lb

## 2023-05-15 DIAGNOSIS — Z124 Encounter for screening for malignant neoplasm of cervix: Secondary | ICD-10-CM | POA: Diagnosis not present

## 2023-05-15 NOTE — Progress Notes (Signed)
    SUBJECTIVE:   CHIEF COMPLAINT / HPI:   Screening for cervical cancer  Patient is due for pap smear. Denies any vaginal irritation, pelvic pain, or abnormal discharge. No abnormal pap smear previously. Declines STI testing.   PERTINENT  PMH / PSH: Hidradenitis, former tobacco use.   OBJECTIVE:   BP 128/71   Pulse 86   Ht 5\' 5"  (1.651 m)   Wt 179 lb 9.6 oz (81.5 kg)   SpO2 97%   BMI 29.89 kg/m   General: well appearing, in no acute distress CV: RRR, radial pulses equal and palpable, no BLE edema  Resp: Normal work of breathing on room air Abd: Soft, non tender, non distended  GU (chaperoned by CMA): Normal external vulva, some clear discharge in the introitus, no vaginal irritation, vulvar lesions, or CMT   ASSESSMENT/PLAN:   Assessment & Plan Screening for cervical cancer Due for pap smear. No concern for cervical dysplasia based on gross examination. - Follow up pap results.       Lockie Mola, MD [facility]

## 2023-05-15 NOTE — Patient Instructions (Signed)
It was wonderful to see you today.  Please bring ALL of your medications with you to every visit.   Today we talked about:  Pap smear - I will follow up with your results as soon as we get them. If everything is normal next time you can start getting the pap every 5 years instead of every 3.   Thank you for choosing [facility].   Please call [phone] with any questions about today's appointment.  Lockie Mola, MD  Family Medicine

## 2023-05-19 LAB — CYTOLOGY - PAP
Comment: NEGATIVE
Diagnosis: NEGATIVE
High risk HPV: NEGATIVE

## 2023-06-25 DIAGNOSIS — K029 Dental caries, unspecified: Secondary | ICD-10-CM | POA: Diagnosis not present

## 2023-07-02 DIAGNOSIS — K029 Dental caries, unspecified: Secondary | ICD-10-CM | POA: Diagnosis not present

## 2023-08-13 DIAGNOSIS — Z0120 Encounter for dental examination and cleaning without abnormal findings: Secondary | ICD-10-CM | POA: Diagnosis not present

## 2023-09-14 ENCOUNTER — Ambulatory Visit: Admitting: Family Medicine

## 2023-09-14 ENCOUNTER — Encounter: Payer: Self-pay | Admitting: Family Medicine

## 2023-09-14 VITALS — BP 121/78 | HR 86 | Ht 65.0 in | Wt 172.0 lb

## 2023-09-14 DIAGNOSIS — Z3200 Encounter for pregnancy test, result unknown: Secondary | ICD-10-CM | POA: Diagnosis not present

## 2023-09-14 LAB — POCT URINE PREGNANCY: Preg Test, Ur: POSITIVE — AB

## 2023-09-14 NOTE — Patient Instructions (Addendum)
 It was great to see you! Thank you for allowing me to participate in your care!  Our plans for today:  - Please call the below number to discuss plans for medical abortion. - If you change your mind please reach back out to the clinic to schedule an appointment.  [facility] [place] [address] [place], [place] [address] [phone] [email or web address]  Please arrive 15 minutes PRIOR to your next scheduled appointment time! If you do not, this affects OTHER patients' care.  Take care and seek immediate care sooner if you develop any concerns.   Ivin Marrow, MD, PGY-2 [facility] 4:05 PM 09/14/2023  [facility]

## 2023-09-14 NOTE — Progress Notes (Signed)
    SUBJECTIVE:   CHIEF COMPLAINT / HPI: home pregnancy test  Not desired. Did not want to be pregnant. Last menstrual cycle 6 weeks ago. Was on Loestrin.  Was not taking consistently.  Noticed you were late which is why she test at home. Has two daughter at home age 31 and 49. No vaginal bleeding or cramps, no discharge. Some nausea. Would [facility]. Eugenio Hew is partner and father of patient's daughter.  States she has discussed pursuing medical termination with him and they agree.   PERTINENT  PMH / PSH: x2 uncomplicated SVDs  OBJECTIVE:   BP 121/78   Pulse 86   Ht 5\' 5"  (1.651 m)   Wt 172 lb (78 kg)   LMP 08/14/2023   SpO2 100%   BMI 28.62 kg/m   General: NAD, well appearing Neuro: A&O Respiratory: normal WOB on RA Extremities: Moving all 4 extremities equally   ASSESSMENT/PLAN:   Assessment & Plan Possible pregnancy, not confirmed Unplanned pregnancy, patient with clear understanding and desire for medical termination of pregnancy.  Provided with resources for women's choice here in [place] to pursue medical termination of pregnancy. Ordered initial OB US  as may be required for medical termination.  Return if symptoms worsen or fail to improve.  Ivin Marrow, MD [facility]

## 2023-09-17 ENCOUNTER — Encounter: Payer: Self-pay | Admitting: Family Medicine

## 2023-09-17 ENCOUNTER — Ambulatory Visit (HOSPITAL_COMMUNITY)
Admission: RE | Admit: 2023-09-17 | Discharge: 2023-09-17 | Disposition: A | Discharge status: Home / Self Care | Source: Ambulatory Visit | Attending: Family Medicine | Admitting: Family Medicine

## 2023-09-17 DIAGNOSIS — Z3200 Encounter for pregnancy test, result unknown: Secondary | ICD-10-CM | POA: Insufficient documentation

## 2023-09-17 DIAGNOSIS — Z3A01 Less than 8 weeks gestation of pregnancy: Secondary | ICD-10-CM | POA: Diagnosis not present

## 2023-09-17 DIAGNOSIS — O3481 Maternal care for other abnormalities of pelvic organs, first trimester: Secondary | ICD-10-CM | POA: Diagnosis not present

## 2023-09-17 DIAGNOSIS — N8312 Corpus luteum cyst of left ovary: Secondary | ICD-10-CM | POA: Diagnosis not present

## 2023-09-23 ENCOUNTER — Telehealth: Payer: Self-pay | Admitting: Family Medicine

## 2023-09-23 DIAGNOSIS — R188 Other ascites: Secondary | ICD-10-CM | POA: Insufficient documentation

## 2023-09-23 NOTE — Telephone Encounter (Signed)
 Called patient regarding pelvic ultrasound results.  Discussed that did confirm 6-week gestational age pregnancy.  Ultrasound also noted possible complex free fluid within the pelvis.  I staff messaged Dr. Ilona Malta OB/Gyn regarding the fluid collection, he recommended as the patient was asymptomatic to recheck a pelvic ultrasound in 6 to 12 weeks.  Could also consider a CT Abd/Pel.  I discussed this plan with the patient who stated she would call to schedule an appointment after her follow-up appointment with [facility].

## 2023-10-21 NOTE — Progress Notes (Unsigned)
    SUBJECTIVE:   CHIEF COMPLAINT / HPI:   Here for follow up Seen for possible pregnancy 4/21, desired termination, transvaginal US  ordered US  showed: single viable IUP, left ovarian corpus luteal cyst with associated small volume mildly complex free fluid within the pelvis OBGYN recommended repeat pelvic ultrasound vs CT A/P in 6-12 weeks   PERTINENT  PMH / PSH: internal hemorrhoids, gHTN  OBJECTIVE:   There were no vitals taken for this visit.  ***  ASSESSMENT/PLAN:   Assessment & Plan      Lisa Cumins, DO [facility]

## 2023-10-22 ENCOUNTER — Ambulatory Visit: Admitting: Family Medicine

## 2023-10-22 ENCOUNTER — Ambulatory Visit (HOSPITAL_COMMUNITY)
Admission: RE | Admit: 2023-10-22 | Discharge: 2023-10-22 | Disposition: A | Discharge status: Home / Self Care | Source: Ambulatory Visit | Attending: Family Medicine | Admitting: Family Medicine

## 2023-10-22 ENCOUNTER — Encounter: Payer: Self-pay | Admitting: Family Medicine

## 2023-10-22 VITALS — BP 113/72 | HR 88 | Ht 64.0 in | Wt 168.4 lb

## 2023-10-22 DIAGNOSIS — N83202 Unspecified ovarian cyst, left side: Secondary | ICD-10-CM | POA: Insufficient documentation

## 2023-10-22 DIAGNOSIS — Z9889 Other specified postprocedural states: Secondary | ICD-10-CM

## 2023-10-22 DIAGNOSIS — N83201 Unspecified ovarian cyst, right side: Secondary | ICD-10-CM | POA: Diagnosis not present

## 2023-10-22 DIAGNOSIS — R9389 Abnormal findings on diagnostic imaging of other specified body structures: Secondary | ICD-10-CM | POA: Diagnosis not present

## 2023-10-22 NOTE — Patient Instructions (Addendum)
 Good to see you today - Thank you for coming in  Things we discussed today:  I am glad you are doing better. Please complete your ultrasound today at 2pm at [facility], I will let you know with the results. It may take at least a week.  Please return if you have any fevers, heavy bleeding, severe stomach pain/cramping or any other concerns

## 2023-10-31 ENCOUNTER — Encounter: Payer: Self-pay | Admitting: Family Medicine

## 2023-11-02 ENCOUNTER — Ambulatory Visit: Payer: Self-pay | Admitting: Family Medicine

## 2023-11-02 NOTE — Progress Notes (Signed)
 Patient called.  Left message for patient to call back. Thickened endometrial stripe on US . Need to confirm if pt has had a menstrual cycle since pregnancy termination. If not, may need to medically induce a cycle and repeat US .

## 2023-11-17 ENCOUNTER — Ambulatory Visit: Admitting: Student

## 2023-11-19 ENCOUNTER — Ambulatory Visit: Admitting: Student

## 2023-11-19 NOTE — Progress Notes (Deleted)
    SUBJECTIVE:   CHIEF COMPLAINT / HPI:   ***  PERTINENT  PMH / PSH: ***  OBJECTIVE:   LMP  (LMP Unknown)   ***  ASSESSMENT/PLAN:   Assessment & Plan      Gladis Church, DO [facility]

## 2024-01-08 ENCOUNTER — Emergency Department (HOSPITAL_COMMUNITY)
Admission: EM | Admit: 2024-01-08 | Discharge: 2024-01-08 | Disposition: A | Discharge status: Home / Self Care | Attending: Emergency Medicine | Admitting: Emergency Medicine

## 2024-01-08 ENCOUNTER — Emergency Department (HOSPITAL_COMMUNITY)

## 2024-01-08 ENCOUNTER — Other Ambulatory Visit: Payer: Self-pay

## 2024-01-08 DIAGNOSIS — E876 Hypokalemia: Secondary | ICD-10-CM | POA: Insufficient documentation

## 2024-01-08 DIAGNOSIS — R1084 Generalized abdominal pain: Secondary | ICD-10-CM | POA: Diagnosis not present

## 2024-01-08 DIAGNOSIS — N83201 Unspecified ovarian cyst, right side: Secondary | ICD-10-CM | POA: Diagnosis not present

## 2024-01-08 DIAGNOSIS — R1030 Lower abdominal pain, unspecified: Secondary | ICD-10-CM | POA: Diagnosis present

## 2024-01-08 DIAGNOSIS — R102 Pelvic and perineal pain: Secondary | ICD-10-CM | POA: Diagnosis not present

## 2024-01-08 DIAGNOSIS — D270 Benign neoplasm of right ovary: Secondary | ICD-10-CM | POA: Insufficient documentation

## 2024-01-08 LAB — URINALYSIS, ROUTINE W REFLEX MICROSCOPIC
Bilirubin Urine: NEGATIVE
Glucose, UA: NEGATIVE mg/dL
Hgb urine dipstick: NEGATIVE
Ketones, ur: NEGATIVE mg/dL
Leukocytes,Ua: NEGATIVE
Nitrite: NEGATIVE
Protein, ur: NEGATIVE mg/dL
Specific Gravity, Urine: 1.017 (ref 1.005–1.030)
pH: 6 (ref 5.0–8.0)

## 2024-01-08 LAB — COMPREHENSIVE METABOLIC PANEL WITH GFR
ALT: 16 U/L (ref 0–44)
AST: 18 U/L (ref 15–41)
Albumin: 3.8 g/dL (ref 3.5–5.0)
Alkaline Phosphatase: 72 U/L (ref 38–126)
Anion gap: 10 (ref 5–15)
BUN: 14 mg/dL (ref 6–20)
CO2: 22 mmol/L (ref 22–32)
Calcium: 8.9 mg/dL (ref 8.9–10.3)
Chloride: 105 mmol/L (ref 98–111)
Creatinine, Ser: 0.94 mg/dL (ref 0.44–1.00)
GFR, Estimated: >60 mL/min (ref >60)
Glucose, Bld: 147 mg/dL — ABNORMAL HIGH (ref 70–99)
Potassium: 3.1 mmol/L — ABNORMAL LOW (ref 3.5–5.1)
Sodium: 137 mmol/L (ref 135–145)
Total Bilirubin: 0.5 mg/dL (ref 0.0–1.2)
Total Protein: 7.3 g/dL (ref 6.5–8.1)

## 2024-01-08 LAB — CBC WITH DIFFERENTIAL/PLATELET
Abs Immature Granulocytes: 0.01 K/uL (ref 0.00–0.07)
Basophils Absolute: 0 K/uL (ref 0.0–0.1)
Basophils Relative: 0 %
Eosinophils Absolute: 0.1 K/uL (ref 0.0–0.5)
Eosinophils Relative: 1 %
HCT: 37.2 % (ref 36.0–46.0)
Hemoglobin: 12.1 g/dL (ref 12.0–15.0)
Immature Granulocytes: 0 %
Lymphocytes Relative: 47 %
Lymphs Abs: 3.4 K/uL (ref 0.7–4.0)
MCH: 28.7 pg (ref 26.0–34.0)
MCHC: 32.5 g/dL (ref 30.0–36.0)
MCV: 88.4 fL (ref 80.0–100.0)
Monocytes Absolute: 0.4 K/uL (ref 0.1–1.0)
Monocytes Relative: 6 %
Neutro Abs: 3.3 K/uL (ref 1.7–7.7)
Neutrophils Relative %: 46 %
Platelets: 143 K/uL — ABNORMAL LOW (ref 150–400)
RBC: 4.21 MIL/uL (ref 3.87–5.11)
RDW: 12.2 % (ref 11.5–15.5)
WBC: 7.1 K/uL (ref 4.0–10.5)
nRBC: 0 % (ref 0.0–0.2)

## 2024-01-08 LAB — LIPASE, BLOOD: Lipase: 34 U/L (ref 11–51)

## 2024-01-08 MED ORDER — FENTANYL CITRATE PF 50 MCG/ML IJ SOSY
50.0000 ug | PREFILLED_SYRINGE | Freq: Once | INTRAMUSCULAR | Status: AC
  Administered 2024-01-08: 50 ug via INTRAVENOUS
  Filled 2024-01-08: qty 1

## 2024-01-08 MED ORDER — POTASSIUM CHLORIDE CRYS ER 20 MEQ PO TBCR
40.0000 meq | EXTENDED_RELEASE_TABLET | Freq: Once | ORAL | Status: AC
  Administered 2024-01-08: 40 meq via ORAL
  Filled 2024-01-08: qty 2

## 2024-01-08 NOTE — ED Triage Notes (Signed)
 Pt BIB EMS for LLQ and RLQ pain that started 45 minutes ago after sex. Sharp constant pain. Denies vag bleeding or discharge

## 2024-01-08 NOTE — ED Provider Notes (Signed)
 [facility] AT [facility] Provider Note   CSN: [id number] Arrival date & time: 01/08/24  9940     Patient presents with: Abdominal Pain   Lisa Woods is a 33 y.o. female.   33 year old female presents with complaint of lower abdominal pain.  Patient reports vaginal intercourse earlier this evening, when she got up and walked away she noted pain across her lower abdomen which was severe in nature.  Improves with lying supine.  Denies any associated symptoms.  Denies concerns for STIs. LMP end of July.       Prior to Admission medications   Medication Sig Start Date End Date Taking? Authorizing Provider  chlorhexidine  (HIBICLENS ) 4 % external liquid Apply topically daily as needed. 08/04/22   Espinoza, Alejandra, DO  norethindrone-ethinyl estradiol-iron (LOESTRIN FE 1.5/30) 1.5-30 MG-MCG tablet Take 1 tablet by mouth daily. Patient not taking: Reported on 08/04/2022 08/26/21   Cresenzo, John V, MD    Allergies: Patient has no known allergies.    Review of Systems Negative except as per HPI Updated Vital Signs BP (!) 147/90   Pulse 76   Temp 98.2 F (36.8 C)   Resp 18   Ht 5' 4 (1.626 m)   Wt 61.2 kg   SpO2 97%   BMI 23.17 kg/m   Physical Exam Vitals and nursing note reviewed.  Constitutional:      General: She is not in acute distress.    Appearance: She is well-developed. She is not diaphoretic.  HENT:     Head: Normocephalic and atraumatic.  Cardiovascular:     Rate and Rhythm: Normal rate and regular rhythm.     Heart sounds: Normal heart sounds.  Pulmonary:     Effort: Pulmonary effort is normal.     Breath sounds: Normal breath sounds.  Abdominal:     Palpations: Abdomen is soft.     Tenderness: There is abdominal tenderness in the right lower quadrant, suprapubic area and left lower quadrant.     Comments: Mild lower abdominal discomfort  Skin:    General: Skin is warm and dry.     Findings: No erythema or rash.   Neurological:     Mental Status: She is alert and oriented to person, place, and time.  Psychiatric:        Behavior: Behavior normal.     (all labs ordered are listed, but only abnormal results are displayed) Labs Reviewed  CBC WITH DIFFERENTIAL/PLATELET - Abnormal; Notable for the following components:      Result Value   Platelets 143 (*)    All other components within normal limits  COMPREHENSIVE METABOLIC PANEL WITH GFR - Abnormal; Notable for the following components:   Potassium 3.1 (*)    Glucose, Bld 147 (*)    All other components within normal limits  LIPASE, BLOOD  URINALYSIS, ROUTINE W REFLEX MICROSCOPIC    EKG: None  Radiology: US  Pelvis Complete Result Date: 01/08/2024 CLINICAL DATA:  33 year old female with pelvic pain after intercourse. LMP 12/21/2023. EXAM: TRANSABDOMINAL AND TRANSVAGINAL ULTRASOUND OF PELVIS DOPPLER ULTRASOUND OF OVARIES TECHNIQUE: Both transabdominal and transvaginal ultrasound examinations of the pelvis were performed. Transabdominal technique was performed for global imaging of the pelvis including uterus, ovaries, adnexal regions, and pelvic cul-de-sac. It was necessary to proceed with endovaginal exam following the transabdominal exam to visualize the ovaries. Color and duplex Doppler ultrasound was utilized to evaluate blood flow to the ovaries. COMPARISON:  Pelvis ultrasound 10/22/2023. FINDINGS: Uterus Measurements: 10.7 x  4.5 x 5.6 cm = volume: 143 mL, stable. No fibroids or other mass visualized. Endometrium Thickness: 16 mm.  No focal abnormality visualized. Right ovary Measurements: 4.1 x 2.1 x 2.5 cm = volume: 11 mL. Right ovarian parenchyma largely normal, but there is around and mildly lobulated small hyperechoic mass adjacent to or arising from the ovary (series 1, image 65) measuring 2.7 x 1.8 x 2.7 cm. No internal vascular elements detected on Doppler. Left ovary Identified transabdominally. Measurements: 4.3 x 1.7 x 2.4 cm = volume: 9  mL. Normal appearance/no adnexal mass. Pulsed Doppler evaluation of both ovaries demonstrates normal low-resistance arterial and venous waveforms. Other findings No abnormal free fluid. IMPRESSION: 1. Negative for ovarian torsion.  Normal uterus and left ovary. 2. Right adnexal 2.7 cm hyperechoic mass adjacent to or arising from the right ovary, possibly a small Dermoid. If not surgically removed annual follow-up imaging recommended to ensure stability. This recommendation follows the consensus statement: Management of Asymptomatic Ovarian and Other Adnexal Cysts Imaged at US : Society of Radiologists in Ultrasound Consensus Conference Statement. Radiology 2010; [phone]. Electronically Signed   By: VEAR Hurst M.D.   On: 01/08/2024 04:14   US  Transvaginal Non-OB Result Date: 01/08/2024 CLINICAL DATA:  33 year old female with pelvic pain after intercourse. LMP 12/21/2023. EXAM: TRANSABDOMINAL AND TRANSVAGINAL ULTRASOUND OF PELVIS DOPPLER ULTRASOUND OF OVARIES TECHNIQUE: Both transabdominal and transvaginal ultrasound examinations of the pelvis were performed. Transabdominal technique was performed for global imaging of the pelvis including uterus, ovaries, adnexal regions, and pelvic cul-de-sac. It was necessary to proceed with endovaginal exam following the transabdominal exam to visualize the ovaries. Color and duplex Doppler ultrasound was utilized to evaluate blood flow to the ovaries. COMPARISON:  Pelvis ultrasound 10/22/2023. FINDINGS: Uterus Measurements: 10.7 x 4.5 x 5.6 cm = volume: 143 mL, stable. No fibroids or other mass visualized. Endometrium Thickness: 16 mm.  No focal abnormality visualized. Right ovary Measurements: 4.1 x 2.1 x 2.5 cm = volume: 11 mL. Right ovarian parenchyma largely normal, but there is around and mildly lobulated small hyperechoic mass adjacent to or arising from the ovary (series 1, image 65) measuring 2.7 x 1.8 x 2.7 cm. No internal vascular elements detected on Doppler. Left  ovary Identified transabdominally. Measurements: 4.3 x 1.7 x 2.4 cm = volume: 9 mL. Normal appearance/no adnexal mass. Pulsed Doppler evaluation of both ovaries demonstrates normal low-resistance arterial and venous waveforms. Other findings No abnormal free fluid. IMPRESSION: 1. Negative for ovarian torsion.  Normal uterus and left ovary. 2. Right adnexal 2.7 cm hyperechoic mass adjacent to or arising from the right ovary, possibly a small Dermoid. If not surgically removed annual follow-up imaging recommended to ensure stability. This recommendation follows the consensus statement: Management of Asymptomatic Ovarian and Other Adnexal Cysts Imaged at US : Society of Radiologists in Ultrasound Consensus Conference Statement. Radiology 2010; [phone]. Electronically Signed   By: VEAR Hurst M.D.   On: 01/08/2024 04:14   US  Art/Ven Flow Abd Pelv Doppler Result Date: 01/08/2024 CLINICAL DATA:  33 year old female with pelvic pain after intercourse. LMP 12/21/2023. EXAM: TRANSABDOMINAL AND TRANSVAGINAL ULTRASOUND OF PELVIS DOPPLER ULTRASOUND OF OVARIES TECHNIQUE: Both transabdominal and transvaginal ultrasound examinations of the pelvis were performed. Transabdominal technique was performed for global imaging of the pelvis including uterus, ovaries, adnexal regions, and pelvic cul-de-sac. It was necessary to proceed with endovaginal exam following the transabdominal exam to visualize the ovaries. Color and duplex Doppler ultrasound was utilized to evaluate blood flow to the ovaries. COMPARISON:  Pelvis ultrasound 10/22/2023.  FINDINGS: Uterus Measurements: 10.7 x 4.5 x 5.6 cm = volume: 143 mL, stable. No fibroids or other mass visualized. Endometrium Thickness: 16 mm.  No focal abnormality visualized. Right ovary Measurements: 4.1 x 2.1 x 2.5 cm = volume: 11 mL. Right ovarian parenchyma largely normal, but there is around and mildly lobulated small hyperechoic mass adjacent to or arising from the ovary (series 1, image  65) measuring 2.7 x 1.8 x 2.7 cm. No internal vascular elements detected on Doppler. Left ovary Identified transabdominally. Measurements: 4.3 x 1.7 x 2.4 cm = volume: 9 mL. Normal appearance/no adnexal mass. Pulsed Doppler evaluation of both ovaries demonstrates normal low-resistance arterial and venous waveforms. Other findings No abnormal free fluid. IMPRESSION: 1. Negative for ovarian torsion.  Normal uterus and left ovary. 2. Right adnexal 2.7 cm hyperechoic mass adjacent to or arising from the right ovary, possibly a small Dermoid. If not surgically removed annual follow-up imaging recommended to ensure stability. This recommendation follows the consensus statement: Management of Asymptomatic Ovarian and Other Adnexal Cysts Imaged at US : Society of Radiologists in Ultrasound Consensus Conference Statement. Radiology 2010; [phone]. Electronically Signed   By: VEAR Hurst M.D.   On: 01/08/2024 04:14     Procedures   Medications Ordered in the ED  fentaNYL  (SUBLIMAZE ) injection 50 mcg (50 mcg Intravenous Given 01/08/24 0223)  potassium chloride  SA (KLOR-CON  M) CR tablet 40 mEq (40 mEq Oral Given 01/08/24 0433)                                    Medical Decision Making Amount and/or Complexity of Data Reviewed Labs: ordered. Radiology: ordered.  Risk Prescription drug management.   This patient presents to the ED for concern of abdominal pain, this involves an extensive number of treatment options, and is a complaint that carries with it a high risk of complications and morbidity.  The differential diagnosis includes but not limited to appendicitis, ovarian cyst, PID, UTI   Co morbidities / Chronic conditions that complicate the patient evaluation  Ovarian cyst   Additional history obtained:  Additional history obtained from EMR External records from outside source obtained and reviewed including prior labs and imaging on file.  Prior pelvic ultrasound completed in June with  thickened endometrial stripe at 18 mm requesting repeat exam which has not been completed as of yet.   Lab Tests:  I Ordered, and personally interpreted labs.  The pertinent results include: CBC without significant findings.  CMP with mild hypokalemia with potassium 3.1 which was replaced orally.  Lipase normal.  Urinalysis unremarkable.   Imaging Studies ordered:  I ordered imaging studies including pelvic ultrasound with Doppler I independently visualized and interpreted imaging which showed right dermoid cyst, negative for torsion.  Endometrial stripe normal. I agree with the radiologist interpretation   Problem List / ED Course / Critical interventions / Medication management  33 year old female presents with pelvic pain after intercourse as above.  Workup today with mild hypokalemia, dermoid cyst on pelvic ultrasound negative for torsion.  On recheck, patient is feeling better, has been ambulatory in the department.  She reports that she is passing some gas and suspects this may have also contributed to her pain.  Plan is to recheck with her gynecologist and return as needed. I ordered medication including fentanyl , potassium Reevaluation of the patient after these medicines showed that the patient pain improved I have reviewed the patients home medicines  and have made adjustments as needed  Social Determinants of Health:  Has PCP   Test / Admission - Considered:  Stable for discharge      Final diagnoses:  Pelvic pain in female  Dermoid cyst of right ovary  Hypokalemia    ED Discharge Orders     None          Beverley Leita DELENA DEVONNA 01/08/24 0450    Griselda Norris, MD 01/08/24 [id number]

## 2024-01-08 NOTE — Discharge Instructions (Addendum)
 Follow-up with your gynecologist in 2 days.  Return to the ER for worsening or concerning symptoms.

## 2024-01-11 ENCOUNTER — Ambulatory Visit (INDEPENDENT_AMBULATORY_CARE_PROVIDER_SITE_OTHER): Admitting: Family Medicine

## 2024-01-11 VITALS — BP 112/80 | HR 71 | Temp 97.9°F | Ht 64.0 in | Wt 169.2 lb

## 2024-01-11 DIAGNOSIS — Z3009 Encounter for other general counseling and advice on contraception: Secondary | ICD-10-CM

## 2024-01-11 DIAGNOSIS — E876 Hypokalemia: Secondary | ICD-10-CM | POA: Diagnosis not present

## 2024-01-11 DIAGNOSIS — N83201 Unspecified ovarian cyst, right side: Secondary | ICD-10-CM

## 2024-01-11 NOTE — Progress Notes (Signed)
    SUBJECTIVE:   CHIEF COMPLAINT / HPI:   Pelvic pain Patient presenting today for ED follow-up for pelvic pain.  Recent ED visit on 8/15 revealed possible dermoid cyst on right ovary, no torsion.  Since ED visit, patient reports feeling much better.  Has been eating normally and stooling normally.  LMP 7/27, no bleeding since.  Interested in Depo-Provera  shot for birth control.   OBJECTIVE:   BP 112/80   Pulse 71   Temp 97.9 F (36.6 C)   Ht 5' 4 (1.626 m)   Wt 169 lb 4 oz (76.8 kg)   LMP 12/20/2023   SpO2 100%   BMI 29.05 kg/m   General: Well-appearing. Resting comfortably in room. CV: Normal S1/S2. No extra heart sounds. Warm and well-perfused. Pulm: Breathing comfortably on room air. CTAB. No increased WOB. Abd: Soft, non-distended.  Mild tenderness to palpation of lower quadrants.  No rebound or guarding. Skin:  Warm, dry. Psych: Pleasant and appropriate.    ASSESSMENT/PLAN:   Assessment & Plan Cyst of right ovary Symptomatically improving since recent ED visit.  Recent pelvic ultrasound with 2.7 cm hyperechoic mass on right ovary, possibly dermoid, no torsion. -Discussed and placed referral to OB/GYN for further cyst management Encounter for other general counseling or advice on contraception Cannot be reasonably certain that patient is not pregnant today per [facility] standards. -Discussed returning for nurse visit for Depo shot when actively menstruating Hypokalemia Patient noted to have potassium 3.1 ED visit recently, was given supplement at the time.  Suspect this will continue to improve with normal diet.   PCP follow-up as needed.  Damien Cassis, MD [facility]

## 2024-01-11 NOTE — Patient Instructions (Signed)
 Thank you for visiting clinic today and allowing us  to participate in your care!  We placed a referral for you to OB/GYN today. Someone should contact you over the next couple of weeks to schedule an appointment.   Please call and schedule a nurse visit to get your Depo shot the next time you're actively on your period.   Please schedule an appointment with your PCP as needed.   Reach out any time with any questions or concerns you may have - we are here for you!  Damien Cassis, MD [facility] [phone]

## 2024-01-11 NOTE — Assessment & Plan Note (Signed)
 Cannot be reasonably certain that patient is not pregnant today per [facility] standards. -Discussed returning for nurse visit for Depo shot when actively menstruating

## 2024-01-21 ENCOUNTER — Ambulatory Visit (INDEPENDENT_AMBULATORY_CARE_PROVIDER_SITE_OTHER)

## 2024-01-21 DIAGNOSIS — Z3042 Encounter for surveillance of injectable contraceptive: Secondary | ICD-10-CM | POA: Diagnosis not present

## 2024-01-21 LAB — POCT URINE PREGNANCY: Preg Test, Ur: NEGATIVE

## 2024-01-21 MED ORDER — MEDROXYPROGESTERONE ACETATE 150 MG/ML IM SUSP
150.0000 mg | Freq: Once | INTRAMUSCULAR | Status: AC
  Administered 2024-01-21: 150 mg via INTRAMUSCULAR

## 2024-01-21 NOTE — Progress Notes (Signed)
 Patient here today to initiate depo provera  injection.   Last contraceptive appt was 01/11/24. Per note from Dr. Diona, patient was to return to clinic at start of menstrual cycle to begin depo. Patient is currently on her period. Urine pregnancy obtained and was also negative.   Depo given in RUOQ today.  Site unremarkable & patient tolerated injection.    Next injection due 04/07/24-04/21/24.  Reminder card given.    Chiquita JAYSON English, RN

## 2024-02-17 DIAGNOSIS — K036 Deposits [accretions] on teeth: Secondary | ICD-10-CM | POA: Diagnosis not present

## 2024-03-21 ENCOUNTER — Ambulatory Visit: Admitting: Obstetrics and Gynecology

## 2024-03-21 VITALS — BP 120/66 | HR 77 | Ht 64.0 in | Wt 175.0 lb

## 2024-03-21 DIAGNOSIS — N83201 Unspecified ovarian cyst, right side: Secondary | ICD-10-CM

## 2024-03-21 NOTE — Progress Notes (Signed)
   GYNECOLOGY PROGRESS NOTE  History:  33 y.o. [id number] presents to [facility] for follow up ovarian cyst. Was seen in ED 8/18 for lower abdominal pain. She was diagnosed with ovarian cyst. She denies pain at this time.  She is on depo for contraception   The following portions of the patient's history were reviewed and updated as appropriate: allergies, current medications, past family history, past medical history, past social history, past surgical history and problem list. Last pap smear on 04/2023 was normal  Health Maintenance Due  Topic Date Due   Hepatitis B Vaccines 19-59 Average Risk (1 of 3 - 19+ 3-dose series) Never done   HPV VACCINES (1 - 3-dose SCDM series) Never done   Influenza Vaccine  12/25/2023   COVID-19 Vaccine (1 - 2025-26 season) Never done     Review of Systems:  Pertinent items are noted in HPI.   Objective:  Physical Exam Blood pressure 120/66, pulse 77, height 5' 4 (1.626 m), weight 175 lb (79.4 kg). VS reviewed, nursing note reviewed,  Constitutional: well developed, well nourished, no distress HEENT: normocephalic Pulm/chest wall: normal effort Breast Exam: deferred Abdomen: soft, nondistended, reports mild discomfort with palpation to RLQ Neuro: alert and oriented Skin: warm, dry Psych: affect normal Pelvic exam: deferred  Assessment & Plan:  1. Cyst of right ovary (Primary) Small hyperechoic mass 2.7cm adjacent or arising from ovary  noted possible dermoid. No torsion noted.  No pain noted today Will reassess on ultrasound, if dermoid will schedule surgical consult   - US  PELVIC COMPLETE WITH TRANSVAGINAL; Future   Nidia Daring, FNP

## 2024-03-21 NOTE — Progress Notes (Signed)
 Pt reports ovarian cyst. Pelvic US  12-2023. Decline pain at this time.

## 2024-03-29 ENCOUNTER — Ambulatory Visit (HOSPITAL_COMMUNITY)
Admission: RE | Admit: 2024-03-29 | Discharge: 2024-03-29 | Disposition: A | Discharge status: Home / Self Care | Source: Ambulatory Visit | Attending: Obstetrics and Gynecology | Admitting: Obstetrics and Gynecology

## 2024-03-29 ENCOUNTER — Encounter (HOSPITAL_COMMUNITY): Payer: Self-pay

## 2024-03-29 DIAGNOSIS — N83201 Unspecified ovarian cyst, right side: Secondary | ICD-10-CM

## 2024-04-05 ENCOUNTER — Ambulatory Visit (HOSPITAL_COMMUNITY)
Admission: RE | Admit: 2024-04-05 | Discharge: 2024-04-05 | Disposition: A | Discharge status: Home / Self Care | Source: Ambulatory Visit | Attending: Obstetrics and Gynecology | Admitting: Obstetrics and Gynecology

## 2024-04-05 DIAGNOSIS — N83201 Unspecified ovarian cyst, right side: Secondary | ICD-10-CM | POA: Diagnosis not present

## 2024-04-05 DIAGNOSIS — N854 Malposition of uterus: Secondary | ICD-10-CM | POA: Diagnosis not present

## 2024-04-08 ENCOUNTER — Ambulatory Visit: Payer: Self-pay | Admitting: Obstetrics and Gynecology

## 2024-05-12 ENCOUNTER — Ambulatory Visit

## 2024-05-12 VITALS — Wt 178.0 lb

## 2024-05-12 DIAGNOSIS — Z3042 Encounter for surveillance of injectable contraceptive: Secondary | ICD-10-CM | POA: Diagnosis not present

## 2024-05-12 DIAGNOSIS — Z3202 Encounter for pregnancy test, result negative: Secondary | ICD-10-CM

## 2024-05-12 LAB — POCT URINE PREGNANCY: Preg Test, Ur: NEGATIVE

## 2024-05-12 MED ORDER — MEDROXYPROGESTERONE ACETATE 150 MG/ML IM SUSP: 150.0000 mg | INTRAMUSCULAR | 3 refills | Status: DC

## 2024-05-12 NOTE — Progress Notes (Signed)
..  Date last pap: 05/15/23. Last Depo-Provera : 01/21/24 at PCP. Side Effects if any: N/a. Serum HCG indicated? N/A. Next appointment due: 2 weeks for 2nd UPT/depo.   Pt came for depo restart and 1st UPT; results negative in office today. Pt stated that she has issues with bumps on inner thighs and it is usually irritated after intercourse. Pt states she was previously advised that issue was due to folliculitis but she wants a 2nd opinion.  Pt scheduled provider visit on 12/30 for evaluation and 2nd UPT/depo start.

## 2024-05-24 ENCOUNTER — Ambulatory Visit: Payer: Self-pay | Admitting: Obstetrics

## 2024-05-24 ENCOUNTER — Other Ambulatory Visit (HOSPITAL_COMMUNITY)
Admission: RE | Admit: 2024-05-24 | Discharge: 2024-05-24 | Disposition: A | Discharge status: Home / Self Care | Source: Ambulatory Visit | Attending: Obstetrics | Admitting: Obstetrics

## 2024-05-24 ENCOUNTER — Encounter: Payer: Self-pay | Admitting: Obstetrics

## 2024-05-24 VITALS — BP 120/75 | HR 76 | Ht 64.0 in | Wt 173.7 lb

## 2024-05-24 DIAGNOSIS — Z1501 Genetic susceptibility to malignant neoplasm of breast: Secondary | ICD-10-CM | POA: Diagnosis not present

## 2024-05-24 DIAGNOSIS — F17200 Nicotine dependence, unspecified, uncomplicated: Secondary | ICD-10-CM

## 2024-05-24 DIAGNOSIS — Z3202 Encounter for pregnancy test, result negative: Secondary | ICD-10-CM

## 2024-05-24 DIAGNOSIS — Z30011 Encounter for initial prescription of contraceptive pills: Secondary | ICD-10-CM

## 2024-05-24 DIAGNOSIS — L0292 Furuncle, unspecified: Secondary | ICD-10-CM

## 2024-05-24 DIAGNOSIS — Z01419 Encounter for gynecological examination (general) (routine) without abnormal findings: Secondary | ICD-10-CM

## 2024-05-24 DIAGNOSIS — Z3009 Encounter for other general counseling and advice on contraception: Secondary | ICD-10-CM | POA: Diagnosis not present

## 2024-05-24 NOTE — Progress Notes (Unsigned)
 Pt presents for swollen and painful area on the inner thigh, and new birth control. Pt is due for her pap.

## 2024-05-24 NOTE — Progress Notes (Unsigned)
 "  Subjective:        Lisa Woods is a 33 y.o. female here for a routine exam.  Current complaints: Boils on inner thigh.    Personal health questionnaire:  Is patient Ashkenazi Jewish, have a family history of breast and/or ovarian cancer: yes Is there a family history of uterine cancer diagnosed at age < 32, gastrointestinal cancer, urinary tract cancer, family member who is a Personnel Officer syndrome-associated carrier: no Is the patient overweight and hypertensive, family history of diabetes, personal history of gestational diabetes, preeclampsia or PCOS: no Is patient over 24, have PCOS,  family history of premature CHD under age 70, diabetes, smoke, have hypertension or peripheral artery disease:  no At any time, has a partner hit, kicked or otherwise hurt or frightened you?: no Over the past 2 weeks, have you felt down, depressed or hopeless?: no Over the past 2 weeks, have you felt little interest or pleasure in doing things?:no   Gynecologic History Patient's last menstrual period was 05/13/2024 (approximate). Contraception: none Last Pap: 2024. Results were: normal Last mammogram: n/a. Results were: n/a  Obstetric History OB History  Gravida Para Term Preterm AB Living  2 2 2   2   SAB IAB Ectopic Multiple Live Births     0 2    # Outcome Date GA Lbr Len/2nd Weight Sex Type Anes PTL Lv  2 Term 05/02/21 [redacted]w[redacted]d 28:30 / 00:58 8 lb 11 oz (3.94 kg) F Vag-Spont EPI  LIV  1 Term 02/13/12 [redacted]w[redacted]d 16:31 / 02:18 7 lb 14.8 oz (3.595 kg) F Vag-Vacuum EPI, Local  LIV    Past Medical History:  Diagnosis Date   Chlamydia     History reviewed. No pertinent surgical history.  Current Medications[1] Allergies[2]  Social History   Tobacco Use   Smoking status: Former    Current packs/day: 0.00    Average packs/day: 0.3 packs/day    Types: Cigars, Cigarettes    Quit date: 12/04/2020    Years since quitting: 3.4   Smokeless tobacco: Never   Tobacco comments:    only smokes 1 cig a day.  Black and mild.  Substance Use Topics   Alcohol use: Not Currently    Comment: occasional , not while preg    Family History  Problem Relation Age of Onset   Cancer Mother        Breast Cancer   Heart Problems Father       Review of Systems  Constitutional: negative for fatigue and weight loss Respiratory: negative for cough and wheezing Cardiovascular: negative for chest pain, fatigue and palpitations Gastrointestinal: negative for abdominal pain and change in bowel habits Musculoskeletal:negative for myalgias Neurological: negative for gait problems and tremors Behavioral/Psych: negative for abusive relationship, depression Endocrine: negative for temperature intolerance    Genitourinary:negative for abnormal menstrual periods, genital lesions, hot flashes, sexual problems and vaginal discharge Integument/breast: negative for breast lump, breast tenderness, nipple discharge and skin lesion(s)    Objective:       BP 120/75   Pulse 76   Ht 5' 4 (1.626 m)   Wt 173 lb 11.2 oz (78.8 kg)   LMP 05/13/2024 (Approximate)   BMI 29.82 kg/m  General:   Alert and no distress  Skin:   no rash or abnormalities  Lungs:   clear to auscultation bilaterally  Heart:   regular rate and rhythm, S1, S2 normal, no murmur, click, rub or gallop  Breasts:   normal without suspicious masses, skin or nipple  changes or axillary nodes  Abdomen:  normal findings: no organomegaly, soft, non-tender and no hernia  Pelvis:  External genitalia: normal general appearance Urinary system: urethral meatus normal and bladder without fullness, nontender Vaginal: normal without tenderness, induration or masses Cervix: normal appearance Adnexa: normal bimanual exam Uterus: anteverted and non-tender, normal size   Lab Review Urine pregnancy test Labs reviewed yes Radiologic studies reviewed no  I have spent a total of 20 minutes of face-to-face time, excluding clinical staff time, reviewing notes and  preparing to see patient, ordering tests and/or medications, and counseling the patient.   Assessment:    1. Encounter for gynecological examination with Papanicolaou smear of cervix (Primary) Rx: - Cytology - PAP( ) - Prenat-Fe Poly-Methfol-FA-DHA (VITAFOL ULTRA) 29-0.6-0.4-200 MG CAPS; Take 1 capsule by mouth daily before breakfast.  Dispense: 90 capsule; Refill: 4  2. Breast cancer genetic susceptibility Rx: - Empower BRCA (2)  3. Encounter for counseling regarding contraception - options discussed.  Wants OCP's  4. Encounter for initial prescription of contraceptive pills Rx: - POCT urine pregnancy - Norethindrone Acetate-Ethinyl Estrad-FE (LOESTRIN 24 FE) 1-20 MG-MCG(24) tablet; Take 1 tablet by mouth daily.  Dispense: 28 tablet; Refill: 11  5. Recurrent boils Rx: - clindamycin  (CLEOCIN ) 300 MG capsule; Take 1 capsule (300 mg total) by mouth 3 (three) times daily.  Dispense: 21 capsule; Refill: 1  6. Tobacco use disorder - cessation encouraged because of increased risk of thrombogenesis      Plan:    Education reviewed: calcium supplements, depression evaluation, low fat, low cholesterol diet, safe sex/STD prevention, self breast exams, skin cancer screening, smoking cessation, and weight bearing exercise. Contraception: OCP (estrogen/progesterone). Follow up in: 1 year.   Meds ordered this encounter  Medications   Norethindrone Acetate-Ethinyl Estrad-FE (LOESTRIN 24 FE) 1-20 MG-MCG(24) tablet    Sig: Take 1 tablet by mouth daily.    Dispense:  28 tablet    Refill:  11   Prenat-Fe Poly-Methfol-FA-DHA (VITAFOL ULTRA) 29-0.6-0.4-200 MG CAPS    Sig: Take 1 capsule by mouth daily before breakfast.    Dispense:  90 capsule    Refill:  4   clindamycin  (CLEOCIN ) 300 MG capsule    Sig: Take 1 capsule (300 mg total) by mouth 3 (three) times daily.    Dispense:  21 capsule    Refill:  1   Orders Placed This Encounter  Procedures   Empower BRCA (2)     Personal and family History of Cancer If there are multiple personal or family cancer history, please enter details below -  Personal History of Cancer:  Cancer Type: Breast Age of Diagnosis:  Cancer Type: Breast Age of Diagnosis:   Family History of Cancer:  Cancer type: Breast Relation: Mother Paternal/Maternal: maternal Age of Diagnosis:    Cancer type: Breast Relation: Mother Paternal/Maternal: maternal Age of Diagnosis:    ==========Department Information========== ID: [id number] Department:[facility] [address], [address] [place] [address] Dept: [phone] Dept Fax: [phone]    Patient and physician allow Jennell to share order details with 3rd party genetic counselor?:   Yes    Does this patient have a personal history of cancer?:   No    Does this patient have a known family history of cancer?:   Yes-complete paperwork and place in kit    By placing this electronic order  I confirm the testing ordered herein is medically necessary and this patient has been informed of the details of the genetic test(s) ordered, including the risks, benefits, and alternatives, and has consented to testing.:   Yes    Select an order diagnosis: For additional options refer to [email or web address]:   Family history of malignant neoplasm of breast [V16.3.ICD-9-CM]    What type of billing?:   Bill Insurance   POCT urine pregnancy    CARLIN RONAL CENTERS, MD, FACOG Attending Obstetrician & Gynecologist, [facility] for [facility], [facility], [facility] 05/24/2024     [1]  Current Outpatient Medications:    clindamycin  (CLEOCIN ) 300 MG capsule, Take 1 capsule (300 mg total) by mouth 3 (three) times daily., Disp: 21 capsule, Rfl: 1   Norethindrone Acetate-Ethinyl Estrad-FE (LOESTRIN 24 FE) 1-20 MG-MCG(24) tablet, Take 1 tablet by  mouth daily., Disp: 28 tablet, Rfl: 11   Norethindrone Acetate-Ethinyl Estrad-FE (LOESTRIN 24 FE) 1-20 MG-MCG(24) tablet, Take 1 tablet by mouth daily., Disp: 28 tablet, Rfl: 11   Prenat-Fe Poly-Methfol-FA-DHA (VITAFOL ULTRA) 29-0.6-0.4-200 MG CAPS, Take 1 capsule by mouth daily before breakfast., Disp: 90 capsule, Rfl: 4   chlorhexidine  (HIBICLENS ) 4 % external liquid, Apply topically daily as needed. (Patient not taking: Reported on 05/12/2024), Disp: 946 mL, Rfl: 1 [2]  Allergies Allergen Reactions   Ibuprofen     "

## 2024-05-26 ENCOUNTER — Other Ambulatory Visit: Payer: Self-pay | Admitting: Obstetrics

## 2024-05-26 ENCOUNTER — Encounter: Payer: Self-pay | Admitting: Obstetrics

## 2024-05-26 DIAGNOSIS — Z30011 Encounter for initial prescription of contraceptive pills: Secondary | ICD-10-CM

## 2024-05-26 MED ORDER — NORETHIN ACE-ETH ESTRAD-FE 1-20 MG-MCG(24) PO TABS: 1.0000 | ORAL_TABLET | Freq: Every day | ORAL | 11 refills | Status: AC

## 2024-05-26 MED ORDER — VITAFOL ULTRA 29-0.6-0.4-200 MG PO CAPS: 1.0000 | ORAL_CAPSULE | Freq: Every day | ORAL | 4 refills | Status: AC

## 2024-05-26 MED ORDER — CLINDAMYCIN HCL 300 MG PO CAPS: 300.0000 mg | ORAL_CAPSULE | Freq: Three times a day (TID) | ORAL | 1 refills | Status: AC

## 2024-05-27 LAB — POCT URINE PREGNANCY: Preg Test, Ur: NEGATIVE

## 2024-05-27 LAB — CYTOLOGY - PAP
Comment: NEGATIVE
Diagnosis: NEGATIVE
High risk HPV: NEGATIVE

## 2024-06-03 LAB — EMPOWER BRCA (2): REPORT SUMMARY: NEGATIVE

## 2024-06-14 ENCOUNTER — Telehealth: Payer: Self-pay

## 2024-06-14 DIAGNOSIS — L732 Hidradenitis suppurativa: Secondary | ICD-10-CM

## 2024-06-14 NOTE — Progress Notes (Unsigned)
 Complex Care Management Note Care Guide Note  06/14/2024 Name: Lisa Woods MRN: [id number] DOB: [birth year]   Complex Care Management Outreach Attempts: An unsuccessful telephone outreach was attempted today to offer the patient information about available complex care management services.  Follow Up Plan:  Additional outreach attempts will be made to offer the patient complex care management information and services.   Encounter Outcome:  No Answer  Dreama [facility]  [facility], [facility] VBCI Assistant Direct Dial: [phone]  Fax: [phone]

## 2024-06-17 NOTE — Progress Notes (Signed)
 Complex Care Management Note  Care Guide Note 06/17/2024 Name: Keaisha Sublette MRN: [id number] DOB: 06/03/1990  Denis Koppel is a 34 y.o. year old female who sees Howell Lunger, [place] for primary care. I reached out to Rexene Louder by phone today to offer complex care management services.  Ms. Pascale was given information about Complex Care Management services today including:   The Complex Care Management services include support from the care team which includes your Nurse Care Manager, Clinical Social Worker, or Pharmacist.  The Complex Care Management team is here to help remove barriers to the health concerns and goals most important to you. Complex Care Management services are voluntary, and the patient may decline or stop services at any time by request to their care team member.   Complex Care Management Consent Status: Patient did not agree to participate in complex care management services at this time.  Follow up plan:  Patient plans to follow up with PCP.  Encounter Outcome:  Patient Refused  Dreama Agent [facility], [facility] VBCI Assistant Direct Dial: [phone]  Fax: [phone]

## 2024-06-22 ENCOUNTER — Telehealth: Payer: Self-pay

## 2024-06-22 MED ORDER — POLYETHYLENE GLYCOL 3350 17 GM/SCOOP PO POWD: 17.0000 g | Freq: Two times a day (BID) | ORAL | 1 refills | Status: AC | PRN

## 2024-06-22 NOTE — Telephone Encounter (Signed)
 Patient calls nurse line requesting a refill on Miralax .   I do not see this one her medication list.   She has a PCP apt scheduled for 1/30.  Will forward to PCP.

## 2024-06-24 ENCOUNTER — Ambulatory Visit: Payer: Self-pay | Admitting: Student

## 2024-07-04 ENCOUNTER — Ambulatory Visit: Admitting: Student
